# Patient Record
Sex: Female | Born: 1940 | Race: White | Hispanic: No | Smoking: Never smoker
Health system: Southern US, Community
[De-identification: ages and names within clinical notes are randomized; demographics above are authoritative.]

## PROBLEM LIST (undated history)

## (undated) DIAGNOSIS — C801 Malignant (primary) neoplasm, unspecified: Secondary | ICD-10-CM

## (undated) DIAGNOSIS — K5790 Diverticulosis of intestine, part unspecified, without perforation or abscess without bleeding: Secondary | ICD-10-CM

## (undated) DIAGNOSIS — K219 Gastro-esophageal reflux disease without esophagitis: Secondary | ICD-10-CM

## (undated) DIAGNOSIS — K8020 Calculus of gallbladder without cholecystitis without obstruction: Secondary | ICD-10-CM

## (undated) DIAGNOSIS — H269 Unspecified cataract: Secondary | ICD-10-CM

## (undated) DIAGNOSIS — T7840XA Allergy, unspecified, initial encounter: Secondary | ICD-10-CM

## (undated) DIAGNOSIS — F32A Depression, unspecified: Secondary | ICD-10-CM

## (undated) DIAGNOSIS — Z8719 Personal history of other diseases of the digestive system: Secondary | ICD-10-CM

## (undated) DIAGNOSIS — I10 Essential (primary) hypertension: Secondary | ICD-10-CM

## (undated) DIAGNOSIS — E785 Hyperlipidemia, unspecified: Secondary | ICD-10-CM

## (undated) DIAGNOSIS — M1990 Unspecified osteoarthritis, unspecified site: Secondary | ICD-10-CM

## (undated) DIAGNOSIS — F329 Major depressive disorder, single episode, unspecified: Secondary | ICD-10-CM

## (undated) DIAGNOSIS — C4491 Basal cell carcinoma of skin, unspecified: Secondary | ICD-10-CM

## (undated) DIAGNOSIS — F419 Anxiety disorder, unspecified: Secondary | ICD-10-CM

## (undated) DIAGNOSIS — G5601 Carpal tunnel syndrome, right upper limb: Secondary | ICD-10-CM

## (undated) HISTORY — DX: Allergy, unspecified, initial encounter: T78.40XA

## (undated) HISTORY — PX: CHOLECYSTECTOMY: SHX55

## (undated) HISTORY — PX: ABDOMINAL HYSTERECTOMY: SHX81

## (undated) HISTORY — DX: Unspecified cataract: H26.9

## (undated) HISTORY — DX: Calculus of gallbladder without cholecystitis without obstruction: K80.20

## (undated) HISTORY — DX: Diverticulosis of intestine, part unspecified, without perforation or abscess without bleeding: K57.90

## (undated) HISTORY — PX: ACHILLES TENDON SURGERY: SHX542

## (undated) HISTORY — DX: Personal history of other diseases of the digestive system: Z87.19

## (undated) HISTORY — PX: APPENDECTOMY: SHX54

## (undated) HISTORY — PX: TUBAL LIGATION: SHX77

## (undated) HISTORY — PX: COLONOSCOPY WITH ESOPHAGOGASTRODUODENOSCOPY (EGD): SHX5779

---

## 1998-07-02 ENCOUNTER — Encounter: Payer: Self-pay | Admitting: Surgery

## 1998-07-02 ENCOUNTER — Ambulatory Visit (HOSPITAL_COMMUNITY): Admission: RE | Admit: 1998-07-02 | Discharge: 1998-07-03 | Payer: Self-pay | Admitting: Surgery

## 1999-04-23 ENCOUNTER — Encounter: Admission: RE | Admit: 1999-04-23 | Discharge: 1999-04-23 | Payer: Self-pay | Admitting: *Deleted

## 1999-04-23 ENCOUNTER — Encounter: Payer: Self-pay | Admitting: *Deleted

## 1999-05-04 ENCOUNTER — Encounter: Admission: RE | Admit: 1999-05-04 | Discharge: 1999-05-04 | Payer: Self-pay | Admitting: Internal Medicine

## 1999-05-04 ENCOUNTER — Encounter: Payer: Self-pay | Admitting: Internal Medicine

## 1999-05-13 ENCOUNTER — Other Ambulatory Visit: Admission: RE | Admit: 1999-05-13 | Discharge: 1999-05-13 | Payer: Self-pay | Admitting: *Deleted

## 2000-04-11 ENCOUNTER — Other Ambulatory Visit: Admission: RE | Admit: 2000-04-11 | Discharge: 2000-04-11 | Payer: Self-pay | Admitting: *Deleted

## 2000-05-04 ENCOUNTER — Encounter: Admission: RE | Admit: 2000-05-04 | Discharge: 2000-05-04 | Payer: Self-pay | Admitting: *Deleted

## 2000-05-04 ENCOUNTER — Encounter: Payer: Self-pay | Admitting: *Deleted

## 2000-06-21 ENCOUNTER — Encounter: Payer: Self-pay | Admitting: Internal Medicine

## 2000-06-21 ENCOUNTER — Encounter: Admission: RE | Admit: 2000-06-21 | Discharge: 2000-06-21 | Payer: Self-pay | Admitting: Internal Medicine

## 2001-04-17 ENCOUNTER — Other Ambulatory Visit: Admission: RE | Admit: 2001-04-17 | Discharge: 2001-04-17 | Payer: Self-pay | Admitting: Obstetrics and Gynecology

## 2002-03-28 ENCOUNTER — Encounter: Payer: Self-pay | Admitting: Neurology

## 2002-03-28 ENCOUNTER — Encounter: Admission: RE | Admit: 2002-03-28 | Discharge: 2002-03-28 | Payer: Self-pay | Admitting: Neurology

## 2002-04-16 ENCOUNTER — Encounter: Admission: RE | Admit: 2002-04-16 | Discharge: 2002-06-07 | Payer: Self-pay | Admitting: Neurology

## 2003-09-18 ENCOUNTER — Emergency Department (HOSPITAL_COMMUNITY): Admission: EM | Admit: 2003-09-18 | Discharge: 2003-09-18 | Payer: Self-pay | Admitting: Emergency Medicine

## 2003-10-10 ENCOUNTER — Ambulatory Visit (HOSPITAL_COMMUNITY): Admission: RE | Admit: 2003-10-10 | Discharge: 2003-10-10 | Payer: Self-pay | Admitting: Internal Medicine

## 2003-11-29 ENCOUNTER — Encounter (INDEPENDENT_AMBULATORY_CARE_PROVIDER_SITE_OTHER): Payer: Self-pay | Admitting: Specialist

## 2003-11-29 ENCOUNTER — Ambulatory Visit (HOSPITAL_BASED_OUTPATIENT_CLINIC_OR_DEPARTMENT_OTHER): Admission: RE | Admit: 2003-11-29 | Discharge: 2003-11-29 | Payer: Self-pay | Admitting: Plastic Surgery

## 2003-11-29 ENCOUNTER — Ambulatory Visit (HOSPITAL_COMMUNITY): Admission: RE | Admit: 2003-11-29 | Discharge: 2003-11-29 | Payer: Self-pay | Admitting: Plastic Surgery

## 2004-04-08 ENCOUNTER — Encounter: Admission: RE | Admit: 2004-04-08 | Discharge: 2004-04-08 | Payer: Self-pay | Admitting: Internal Medicine

## 2004-06-04 ENCOUNTER — Other Ambulatory Visit: Admission: RE | Admit: 2004-06-04 | Discharge: 2004-06-04 | Payer: Self-pay | Admitting: Obstetrics and Gynecology

## 2005-02-11 ENCOUNTER — Ambulatory Visit: Payer: Self-pay | Admitting: Internal Medicine

## 2005-04-13 ENCOUNTER — Ambulatory Visit: Payer: Self-pay | Admitting: Internal Medicine

## 2005-04-29 ENCOUNTER — Ambulatory Visit: Payer: Self-pay | Admitting: Internal Medicine

## 2005-04-29 ENCOUNTER — Encounter (INDEPENDENT_AMBULATORY_CARE_PROVIDER_SITE_OTHER): Payer: Self-pay | Admitting: *Deleted

## 2008-04-24 ENCOUNTER — Telehealth (INDEPENDENT_AMBULATORY_CARE_PROVIDER_SITE_OTHER): Payer: Self-pay | Admitting: *Deleted

## 2008-04-24 ENCOUNTER — Encounter: Payer: Self-pay | Admitting: Internal Medicine

## 2009-12-12 ENCOUNTER — Encounter: Admission: RE | Admit: 2009-12-12 | Discharge: 2009-12-12 | Payer: Self-pay | Admitting: Internal Medicine

## 2009-12-24 ENCOUNTER — Encounter: Admission: RE | Admit: 2009-12-24 | Discharge: 2009-12-24 | Payer: Self-pay | Admitting: Internal Medicine

## 2010-07-03 NOTE — Op Note (Signed)
NAMEDAVA, RENSCH NO.:  [id number]   MEDICAL RECORD NO.:  [id number]          PATIENT TYPE:  AMB   LOCATION:  DSC                          FACILITY:  [facility]   PHYSICIAN:  Etter Sjogren, M.D.     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   POSTOPERATIVE DIAGNOSIS:  1.  Basal cell carcinoma of the nose greater than 1.0 cm.  2.  Complicated open wound of the nose, 2.5 cm.   PROCEDURE PERFORMED:  1.  Excision basal cell carcinoma of the nose greater than 1.0 cm.  2.  Complex wound closure of the nose, 2.5 cm, secondary to defect from      basal cell carcinoma.   SURGEON:  Etter Sjogren, M.D.   ANESTHESIA:  Xylocaine 1% with epinephrine plus bicarb.   CLINICAL NOTE:  A 70 year old woman with basal cell carcinoma of the dorsum  of her nose.  This is biopsy proven.  The wider excision of this has been  discussed with her and she understood the __________ and the risks including  the possibility of further surgery depending on the final pathology report.  She also understands that there will be scarring and the potential for wound  healing problems or infection.  She wishes to proceed.   DESCRIPTION OF PROCEDURE:  The patient was marked for the elliptical  excision, oriented in transverse direction.  She was then prepped with  Betadine and draped with sterile drapes.  Satisfactory local anesthesia was  achieved.  The excision was performed and the specimen tagged on the  superior border with a stitch.  The wound was irrigated thoroughly and  excellent hemostasis was noted.  A layered closure with 4-0 Monocryl  interrupted inverted deep sutures, 4-0 Monocryl interrupted inverted deep  dermal sutures and a 6-0 Prolene simple running suture.  Antibiotic ointment  applied.  Tolerated well.   DISPOSITION:  Recheck in the office next week.       DB/MEDQ  D:  11/29/2003  T:  11/29/2003  Job:  [id number]

## 2011-01-04 ENCOUNTER — Other Ambulatory Visit: Payer: Self-pay | Admitting: Internal Medicine

## 2011-01-04 DIAGNOSIS — Z1231 Encounter for screening mammogram for malignant neoplasm of breast: Secondary | ICD-10-CM

## 2011-02-12 ENCOUNTER — Ambulatory Visit
Admission: RE | Admit: 2011-02-12 | Discharge: 2011-02-12 | Disposition: A | Discharge status: Home / Self Care | Payer: Medicare Other | Source: Ambulatory Visit | Attending: Internal Medicine | Admitting: Internal Medicine

## 2011-02-12 DIAGNOSIS — Z1231 Encounter for screening mammogram for malignant neoplasm of breast: Secondary | ICD-10-CM

## 2011-07-15 ENCOUNTER — Other Ambulatory Visit: Payer: Self-pay | Admitting: Orthopedic Surgery

## 2011-07-20 ENCOUNTER — Encounter (HOSPITAL_BASED_OUTPATIENT_CLINIC_OR_DEPARTMENT_OTHER): Payer: Self-pay | Admitting: *Deleted

## 2011-07-21 ENCOUNTER — Encounter (HOSPITAL_BASED_OUTPATIENT_CLINIC_OR_DEPARTMENT_OTHER)
Admission: RE | Admit: 2011-07-21 | Discharge: 2011-07-21 | Disposition: A | Discharge status: Home / Self Care | Payer: Medicare Other | Source: Ambulatory Visit | Attending: Orthopedic Surgery | Admitting: Orthopedic Surgery

## 2011-07-21 LAB — BASIC METABOLIC PANEL
BUN: 10 mg/dL (ref 6–23)
CO2: 31 mEq/L (ref 19–32)
Chloride: 100 mEq/L (ref 96–112)
Creatinine, Ser: 0.94 mg/dL (ref 0.50–1.10)
GFR calc Af Amer: 70 mL/min — ABNORMAL LOW (ref >90)
Glucose, Bld: 88 mg/dL (ref 70–99)
Potassium: 4.3 mEq/L (ref 3.5–5.1)

## 2011-07-21 NOTE — Progress Notes (Signed)
Dr.Federick reviewed EKG -- OK for surgery.

## 2011-07-22 ENCOUNTER — Other Ambulatory Visit: Payer: Self-pay | Admitting: Dermatology

## 2011-07-22 NOTE — H&P (Signed)
Mackenzie White is an 71 y.o. female.   Chief Complaint: c/o chronic and progressive numbness and tinging right hand HPI: Mackenzie White is a 71 year old [address] [employer]. She has had a 3 month history of bilateral hand numbness consistent with carpal tunnel syndrome. She has no antecedent history of injury. She has no history of neck pain. She has no history of diabetes, thyroid disease, gout or rheumatoid arthritis. She has undergone several injections into the right wrist with only short term relief. The patient wishes to proceed with right  CTR.    Past Medical History  Diagnosis Date  . Hypertension   . Hyperlipemia   . Depression   . GERD (gastroesophageal reflux disease)   . Carpal tunnel syndrome of right wrist   . Arthritis     Past Surgical History  Procedure Date  . Tubal ligation   . Cholecystectomy   . Abdominal hysterectomy   . Appendectomy   . Achilles tendon surgery     left     No family history on file. Social History:  reports that she has never smoked. She does not have any smokeless tobacco history on file. She reports that she does not drink alcohol or use illicit drugs.  Allergies:  Allergies  Allergen Reactions  . Asa (Aspirin) Hives  . Penicillins Swelling  . Sulfa Antibiotics Hives  . Tetracyclines & Related Hives    No prescriptions prior to admission    Results for orders placed during the hospital encounter of 07/23/11 (from the past 48 hour(s))  BASIC METABOLIC PANEL     Status: Abnormal   Collection Time   07/21/11 11:57 AM      Component Value Range Comment   Sodium 139  135 - 145 (mEq/L)    Potassium 4.3  3.5 - 5.1 (mEq/L)    Chloride 100  96 - 112 (mEq/L)    CO2 31  19 - 32 (mEq/L)    Glucose, Bld 88  70 - 99 (mg/dL)    BUN 10  6 - 23 (mg/dL)    Creatinine, Ser 3.41  0.50 - 1.10 (mg/dL)    Calcium 9.7  8.4 - 10.5 (mg/dL)    GFR calc non Af Amer 60 (*) >90 (mL/min)    GFR calc Af Amer 70 (*) >90 (mL/min)      No results found.   Pertinent items are noted in HPI.  Height 5' 6.5" (1.689 m), weight 82.555 kg (182 lb).  General appearance: alert Head: Normocephalic, without obvious abnormality Neck: supple, symmetrical, trachea midline Resp: clear to auscultation bilaterally Cardio: regular rate and rhythm GI: normal findings: bowel sounds normal Extremities:Inspection of her hands reveals no muscle atrophy or deformity. She does have a positive Tinel's sign on the right. She has a positive elbow hyperflexion test reproducing small finger numbness on the right at 1 minute. She has positive wrist flexion right.  Her cervical ROM reveals moderate stiffness with forward flexion 30 degrees, extension 10 degrees, rotation right 45 and rotation left 35. She has no radicular signs or symptoms.   Pulses: 2+ and symmetric Skin: normal Neurologic: Grossly normal    Assessment/Plan Impression: Right CTS  Plan: To the OR for right CTR. The procedure, risks and post-op course were discussed with the patient at length and she was in agreement with the plan.  DASNOIT,Antwione Picotte J 07/22/2011, 3:56 PM    H&P documentation: 07/23/2011  -History and Physical Reviewed  -Patient has been re-examined  -No  change in the plan of care  Wyn Forsterobert V Cleona Doubleday Jr, MD

## 2011-07-23 ENCOUNTER — Encounter (HOSPITAL_BASED_OUTPATIENT_CLINIC_OR_DEPARTMENT_OTHER): Payer: Self-pay | Admitting: Anesthesiology

## 2011-07-23 ENCOUNTER — Ambulatory Visit (HOSPITAL_BASED_OUTPATIENT_CLINIC_OR_DEPARTMENT_OTHER)
Admission: RE | Admit: 2011-07-23 | Discharge: 2011-07-23 | Disposition: A | Discharge status: Home / Self Care | Payer: Medicare Other | Source: Ambulatory Visit | Attending: Orthopedic Surgery | Admitting: Orthopedic Surgery

## 2011-07-23 ENCOUNTER — Ambulatory Visit (HOSPITAL_BASED_OUTPATIENT_CLINIC_OR_DEPARTMENT_OTHER): Payer: Medicare Other | Admitting: Anesthesiology

## 2011-07-23 ENCOUNTER — Encounter (HOSPITAL_BASED_OUTPATIENT_CLINIC_OR_DEPARTMENT_OTHER)
Admission: RE | Disposition: A | Discharge status: Home / Self Care | Payer: Self-pay | Source: Ambulatory Visit | Attending: Orthopedic Surgery

## 2011-07-23 ENCOUNTER — Encounter (HOSPITAL_BASED_OUTPATIENT_CLINIC_OR_DEPARTMENT_OTHER): Payer: Self-pay | Admitting: *Deleted

## 2011-07-23 DIAGNOSIS — I10 Essential (primary) hypertension: Secondary | ICD-10-CM | POA: Insufficient documentation

## 2011-07-23 DIAGNOSIS — K219 Gastro-esophageal reflux disease without esophagitis: Secondary | ICD-10-CM | POA: Insufficient documentation

## 2011-07-23 DIAGNOSIS — G5600 Carpal tunnel syndrome, unspecified upper limb: Secondary | ICD-10-CM | POA: Insufficient documentation

## 2011-07-23 DIAGNOSIS — Z01812 Encounter for preprocedural laboratory examination: Secondary | ICD-10-CM | POA: Insufficient documentation

## 2011-07-23 DIAGNOSIS — Z01810 Encounter for preprocedural cardiovascular examination: Secondary | ICD-10-CM | POA: Insufficient documentation

## 2011-07-23 HISTORY — DX: Hyperlipidemia, unspecified: E78.5

## 2011-07-23 HISTORY — PX: CARPAL TUNNEL RELEASE: SHX101

## 2011-07-23 HISTORY — DX: Unspecified osteoarthritis, unspecified site: M19.90

## 2011-07-23 HISTORY — DX: Essential (primary) hypertension: I10

## 2011-07-23 HISTORY — DX: Gastro-esophageal reflux disease without esophagitis: K21.9

## 2011-07-23 HISTORY — DX: Depression, unspecified: F32.A

## 2011-07-23 HISTORY — DX: Major depressive disorder, single episode, unspecified: F32.9

## 2011-07-23 HISTORY — DX: Carpal tunnel syndrome, right upper limb: G56.01

## 2011-07-23 SURGERY — CARPAL TUNNEL RELEASE
Anesthesia: General | Site: Hand | Laterality: Right | Wound class: Clean

## 2011-07-23 MED ORDER — HYDROCODONE-ACETAMINOPHEN 5-325 MG PO TABS
1.0000 | ORAL_TABLET | Freq: Once | ORAL | Status: AC
  Administered 2011-07-23: 1 via ORAL

## 2011-07-23 MED ORDER — METOCLOPRAMIDE HCL 5 MG/ML IJ SOLN: 10.0000 mg | Freq: Once | INTRAMUSCULAR | Status: DC | PRN

## 2011-07-23 MED ORDER — HYDROCODONE-ACETAMINOPHEN 5-325 MG PO TABS: ORAL_TABLET | ORAL | Status: AC

## 2011-07-23 MED ORDER — ONDANSETRON HCL 4 MG/2ML IJ SOLN
INTRAMUSCULAR | Status: DC | PRN
  Administered 2011-07-23: 4 mg via INTRAVENOUS

## 2011-07-23 MED ORDER — CHLORHEXIDINE GLUCONATE 4 % EX LIQD: 60.0000 mL | Freq: Once | CUTANEOUS | Status: DC

## 2011-07-23 MED ORDER — FENTANYL CITRATE 0.05 MG/ML IJ SOLN: 25.0000 ug | INTRAMUSCULAR | Status: DC | PRN

## 2011-07-23 MED ORDER — LIDOCAINE HCL (CARDIAC) 20 MG/ML IV SOLN
INTRAVENOUS | Status: DC | PRN
  Administered 2011-07-23: 200 mg via INTRAVENOUS
  Administered 2011-07-23: 50 mg via INTRAVENOUS
  Administered 2011-07-23: 100 mg via INTRAVENOUS

## 2011-07-23 MED ORDER — OXYCODONE HCL 5 MG PO TABS: 5.0000 mg | ORAL_TABLET | Freq: Once | ORAL | Status: DC | PRN

## 2011-07-23 MED ORDER — LIDOCAINE HCL 2 % IJ SOLN
INTRAMUSCULAR | Status: DC | PRN
  Administered 2011-07-23: 4 mL

## 2011-07-23 MED ORDER — GLYCOPYRROLATE 0.2 MG/ML IJ SOLN
INTRAMUSCULAR | Status: DC | PRN
  Administered 2011-07-23: 0.2 mg via INTRAVENOUS

## 2011-07-23 MED ORDER — DEXAMETHASONE SODIUM PHOSPHATE 4 MG/ML IJ SOLN
INTRAMUSCULAR | Status: DC | PRN
  Administered 2011-07-23: 10 mg via INTRAVENOUS

## 2011-07-23 MED ORDER — FENTANYL CITRATE 0.05 MG/ML IJ SOLN
INTRAMUSCULAR | Status: DC | PRN
  Administered 2011-07-23: 50 ug via INTRAVENOUS
  Administered 2011-07-23: 25 ug via INTRAVENOUS

## 2011-07-23 MED ORDER — METOCLOPRAMIDE HCL 5 MG/ML IJ SOLN
INTRAMUSCULAR | Status: DC | PRN
  Administered 2011-07-23: 10 mg via INTRAVENOUS

## 2011-07-23 MED ORDER — LACTATED RINGERS IV SOLN
INTRAVENOUS | Status: DC
  Administered 2011-07-23: 07:00:00 via INTRAVENOUS

## 2011-07-23 SURGICAL SUPPLY — 40 items
BANDAGE ADHESIVE 1X3 (GAUZE/BANDAGES/DRESSINGS) IMPLANT
BANDAGE ELASTIC 3 VELCRO ST LF (GAUZE/BANDAGES/DRESSINGS) ×2 IMPLANT
BLADE SURG 15 STRL LF DISP TIS (BLADE) ×1 IMPLANT
BLADE SURG 15 STRL SS (BLADE) ×2
BNDG CMPR 9X4 STRL LF SNTH (GAUZE/BANDAGES/DRESSINGS) ×1
BNDG ESMARK 4X9 LF (GAUZE/BANDAGES/DRESSINGS) ×1 IMPLANT
BRUSH SCRUB EZ PLAIN DRY (MISCELLANEOUS) ×2 IMPLANT
CLOTH BEACON ORANGE TIMEOUT ST (SAFETY) ×2 IMPLANT
CORDS BIPOLAR (ELECTRODE) IMPLANT
COVER MAYO STAND STRL (DRAPES) ×2 IMPLANT
COVER TABLE BACK 60X90 (DRAPES) ×2 IMPLANT
CUFF TOURNIQUET SINGLE 18IN (TOURNIQUET CUFF) ×1 IMPLANT
DECANTER SPIKE VIAL GLASS SM (MISCELLANEOUS) ×1 IMPLANT
DRAPE EXTREMITY T 121X128X90 (DRAPE) ×2 IMPLANT
DRAPE SURG 17X23 STRL (DRAPES) ×2 IMPLANT
GLOVE BIO SURGEON STRL SZ 6.5 (GLOVE) ×1 IMPLANT
GLOVE BIOGEL M STRL SZ7.5 (GLOVE) ×2 IMPLANT
GLOVE BIOGEL PI IND STRL 7.0 (GLOVE) IMPLANT
GLOVE BIOGEL PI INDICATOR 7.0 (GLOVE) ×1
GLOVE EXAM NITRILE EXT CUFF MD (GLOVE) ×1 IMPLANT
GLOVE ORTHO TXT STRL SZ7.5 (GLOVE) ×2 IMPLANT
GOWN PREVENTION PLUS XLARGE (GOWN DISPOSABLE) ×2 IMPLANT
GOWN PREVENTION PLUS XXLARGE (GOWN DISPOSABLE) ×4 IMPLANT
NEEDLE 27GAX1X1/2 (NEEDLE) ×1 IMPLANT
PACK BASIN DAY SURGERY FS (CUSTOM PROCEDURE TRAY) ×2 IMPLANT
PAD CAST 3X4 CTTN HI CHSV (CAST SUPPLIES) ×1 IMPLANT
PADDING CAST ABS 4INX4YD NS (CAST SUPPLIES) ×1
PADDING CAST ABS COTTON 4X4 ST (CAST SUPPLIES) ×1 IMPLANT
PADDING CAST COTTON 3X4 STRL (CAST SUPPLIES) ×2
SPLINT PLASTER CAST XFAST 3X15 (CAST SUPPLIES) ×5 IMPLANT
SPLINT PLASTER XTRA FASTSET 3X (CAST SUPPLIES) ×5
SPONGE GAUZE 4X4 12PLY (GAUZE/BANDAGES/DRESSINGS) ×2 IMPLANT
STOCKINETTE 4X48 STRL (DRAPES) ×2 IMPLANT
STRIP CLOSURE SKIN 1/2X4 (GAUZE/BANDAGES/DRESSINGS) ×2 IMPLANT
SUT PROLENE 3 0 PS 2 (SUTURE) ×2 IMPLANT
SYR 3ML 23GX1 SAFETY (SYRINGE) IMPLANT
SYR CONTROL 10ML LL (SYRINGE) ×1 IMPLANT
TRAY DSU PREP LF (CUSTOM PROCEDURE TRAY) ×2 IMPLANT
UNDERPAD 30X30 INCONTINENT (UNDERPADS AND DIAPERS) ×2 IMPLANT
WATER STERILE IRR 1000ML POUR (IV SOLUTION) ×1 IMPLANT

## 2011-07-23 NOTE — Discharge Instructions (Signed)

## 2011-07-23 NOTE — Brief Op Note (Signed)
07/23/2011  8:08 AM  PATIENT:  Mackenzie White  71 y.o. female  PRE-OPERATIVE DIAGNOSIS:  Right Carpal Tunnel Syndrome  POST-OPERATIVE DIAGNOSIS:  Right Carpal Tunnel Syndrome  PROCEDURE:  Procedure(s) (LRB): CARPAL TUNNEL RELEASE (Right)  SURGEON:  Surgeon(s) and Role:    * Wyn Forster., MD - Primary  PHYSICIAN ASSISTANT:   ASSISTANTS: Mallory Shirk.A-C    ANESTHESIA:   general  EBL:  Total I/O In: 800 [I.V.:800] Out: -   BLOOD ADMINISTERED:none  DRAINS: none   LOCAL MEDICATIONS USED:  XYLOCAINE   SPECIMEN:  No Specimen  DISPOSITION OF SPECIMEN:  N/A  COUNTS:  YES  TOURNIQUET:   Total Tourniquet Time Documented: Upper Arm (Right) - 8 minutes  DICTATION: .Other Dictation: Dictation Number [id number]  PLAN OF CARE: Discharge to home after PACU  PATIENT DISPOSITION:  PACU - hemodynamically stable.

## 2011-07-23 NOTE — Anesthesia Procedure Notes (Signed)
Procedure Name: LMA Insertion Performed by: York Grice Pre-anesthesia Checklist: Patient identified, Timeout performed, Emergency Drugs available, Suction available and Patient being monitored Patient Re-evaluated:Patient Re-evaluated prior to inductionOxygen Delivery Method: Circle system utilized Preoxygenation: Pre-oxygenation with 100% oxygen Intubation Type: IV induction Ventilation: Mask ventilation without difficulty LMA: LMA inserted LMA Size: 5.0 Number of attempts: 2 Placement Confirmation: breath sounds checked- equal and bilateral and positive ETCO2 Tube secured with: Tape Dental Injury: Teeth and Oropharynx as per pre-operative assessment

## 2011-07-23 NOTE — Op Note (Signed)
[name], [name] NO.:  [id number]  MEDICAL RECORD NO.:  [id number]  LOCATION:                                 FACILITY:  PHYSICIAN:  Katy Fitch. Klark Vanderhoef, M.D.      DATE OF BIRTH:  DATE OF PROCEDURE:  07/23/2011 DATE OF DISCHARGE:                              OPERATIVE REPORT   PREOPERATIVE DIAGNOSIS:  Entrapment neuropathy, median nerve, right carpal tunnel.  POSTOPERATIVE DIAGNOSIS: Entrapment neuropathy, median nerve, right carpal tunnel.  OPERATION:  Release of right transverse carpal ligament.  OPERATING SURGEON:  Katy Fitch. Wayne Wicklund, M.D.  ASSISTANT:  Marveen Reeks. Dasnoit, PA-C.  ANESTHESIA:  General by LMA.  SUPERVISING ANESTHESIOLOGIST:  Janetta Hora. Gelene Mink, M.D.  INDICATIONS:  Mackenzie White is a 71 year old woman who referred through the courtesy of Dr. Wylene Simmer.  Clinical examination revealed signs of bilateral carpal tunnel syndrome, worse on the right. Electrodiagnostic studies confirmed right carpal tunnel syndrome.  After informed consent, she was brought to the operating at this time for release of her right transcarpal ligament.  PROCEDURE:  Pacey Willadsen was brought to room 1 of the [facility] and placed in supine position on the operating table.  Following a detailed anesthesia informed consent by Dr. Gelene Mink, general anesthesia by LMA technique was recommended and accepted.  In room 1, under Dr. Thornton Dales direct supervision, general anesthesia by LMA technique was induced followed by routine Betadine scrub and paint of the right upper extremity.  Sterile drapes were applied followed by a pneumatic tourniquet to the proximal right brachium.  Following a routine surgical time-out, the right arm was exsanguinated with Esmarch bandage and the arterial tourniquet was inflated to 220 mmHg.  The procedure commenced with a short incision in the line of the ring finger in the palm.  Subcutaneous tissues were carefully  divided to reveal the palmar fascia.  This split longitudinally to reveal the common sensory branch of the median nerve.  These were followed back to the median nerve proper which was gently isolated from the transcarpal ligament with a Insurance risk surveyor.  The transverse carpal ligament was then released along its ulnar border with scissors extending into the distal forearm.  This widely opened carpal canal.  No masses or other predicaments were noted.  Bleeding points along the margin of the released ligament were electrocauterized with bipolar current followed by repair of the skin with intradermal 3-0 Prolene suture.  A compressive dressing was applied with a volar plaster splint maintaining the wrist in 10 degrees of dorsiflexion.  For aftercare, Earlean Polka is provided a prescription for hydrocodone 5 mg 1 p.o. q.4 hours p.r.n. pain 20 tablets without refill.     Katy Fitch Yamil Oelke, M.D.     RVS/MEDQ  D:  07/23/2011  T:  07/23/2011  Job:  [id number]  cc:   Gaspar Garbe, M.D.

## 2011-07-23 NOTE — OR Nursing (Signed)
0757--Time for time out documented at 0747 incorrectly--was performed at 0757.

## 2011-07-23 NOTE — Anesthesia Preprocedure Evaluation (Signed)
Anesthesia Evaluation  Patient identified by MRN, date of birth, ID band Patient awake    Reviewed: Allergy & Precautions, H&P , NPO status , Patient's Chart, lab work & pertinent test results, reviewed documented beta blocker date and time   Airway Mallampati: II TM Distance: >3 FB Neck ROM: full    Dental   Pulmonary neg pulmonary ROS,          Cardiovascular hypertension, On Medications     Neuro/Psych  Neuromuscular disease negative psych ROS   GI/Hepatic negative GI ROS, Neg liver ROS,   Endo/Other  negative endocrine ROS  Renal/GU negative Renal ROS  negative genitourinary   Musculoskeletal   Abdominal   Peds  Hematology negative hematology ROS (+)   Anesthesia Other Findings See surgeon's H&P   Reproductive/Obstetrics negative OB ROS                           Anesthesia Physical Anesthesia Plan  ASA: II  Anesthesia Plan: General   Post-op Pain Management:    Induction: Intravenous  Airway Management Planned: LMA  Additional Equipment:   Intra-op Plan:   Post-operative Plan: Extubation in OR  Informed Consent: I have reviewed the patients History and Physical, chart, labs and discussed the procedure including the risks, benefits and alternatives for the proposed anesthesia with the patient or authorized representative who has indicated his/her understanding and acceptance.   Dental Advisory Given  Plan Discussed with: CRNA and Surgeon  Anesthesia Plan Comments:         Anesthesia Quick Evaluation

## 2011-07-23 NOTE — Anesthesia Postprocedure Evaluation (Signed)
Anesthesia Post Note  Patient: Mackenzie White  Procedure(s) Performed: Procedure(s) (LRB): CARPAL TUNNEL RELEASE (Right)  Anesthesia type: General  Patient location: PACU  Post pain: Pain level controlled  Post assessment: Patient's Cardiovascular Status Stable  Last Vitals:  Filed Vitals:   07/23/11 0845  BP: 122/68  Pulse: 102  Temp:   Resp: 16    Post vital signs: Reviewed and stable  Level of consciousness: alert  Complications: No apparent anesthesia complications

## 2011-07-23 NOTE — Transfer of Care (Signed)
Immediate Anesthesia Transfer of Care Note  Patient: Mackenzie White  Procedure(s) Performed: Procedure(s) (LRB): CARPAL TUNNEL RELEASE (Right)  Patient Location: PACU  Anesthesia Type: General  Level of Consciousness: awake and alert   Airway & Oxygen Therapy: Patient Spontanous Breathing and Patient connected to face mask oxygen  Post-op Assessment: Report given to PACU RN and Post -op Vital signs reviewed and stable  Post vital signs: Reviewed and stable  Complications: No apparent anesthesia complications

## 2011-07-26 ENCOUNTER — Encounter (HOSPITAL_BASED_OUTPATIENT_CLINIC_OR_DEPARTMENT_OTHER): Payer: Self-pay | Admitting: Orthopedic Surgery

## 2011-11-11 ENCOUNTER — Encounter: Payer: Self-pay | Admitting: Internal Medicine

## 2011-11-17 ENCOUNTER — Other Ambulatory Visit: Payer: Self-pay | Admitting: Dermatology

## 2012-12-19 ENCOUNTER — Other Ambulatory Visit: Payer: Self-pay | Admitting: Dermatology

## 2013-02-15 DIAGNOSIS — H269 Unspecified cataract: Secondary | ICD-10-CM

## 2013-02-15 HISTORY — DX: Unspecified cataract: H26.9

## 2014-07-10 ENCOUNTER — Other Ambulatory Visit: Payer: Self-pay

## 2014-07-10 DIAGNOSIS — Z1231 Encounter for screening mammogram for malignant neoplasm of breast: Secondary | ICD-10-CM

## 2014-08-07 ENCOUNTER — Ambulatory Visit
Admission: RE | Admit: 2014-08-07 | Discharge: 2014-08-07 | Disposition: A | Discharge status: Home / Self Care | Payer: Medicare Other | Source: Ambulatory Visit

## 2014-08-07 DIAGNOSIS — Z1231 Encounter for screening mammogram for malignant neoplasm of breast: Secondary | ICD-10-CM

## 2014-08-09 ENCOUNTER — Ambulatory Visit
Admission: RE | Admit: 2014-08-09 | Discharge: 2014-08-09 | Disposition: A | Discharge status: Home / Self Care | Payer: Medicare Other | Source: Ambulatory Visit | Attending: Internal Medicine | Admitting: Internal Medicine

## 2014-08-09 ENCOUNTER — Other Ambulatory Visit: Payer: Self-pay | Admitting: Internal Medicine

## 2014-08-09 DIAGNOSIS — R922 Inconclusive mammogram: Secondary | ICD-10-CM

## 2014-08-09 DIAGNOSIS — N6320 Unspecified lump in the left breast, unspecified quadrant: Secondary | ICD-10-CM

## 2014-08-12 ENCOUNTER — Other Ambulatory Visit: Payer: Self-pay | Admitting: Internal Medicine

## 2014-08-12 DIAGNOSIS — N6320 Unspecified lump in the left breast, unspecified quadrant: Secondary | ICD-10-CM

## 2014-08-20 ENCOUNTER — Other Ambulatory Visit: Payer: Self-pay | Admitting: Internal Medicine

## 2014-08-20 ENCOUNTER — Ambulatory Visit
Admission: RE | Admit: 2014-08-20 | Discharge: 2014-08-20 | Disposition: A | Discharge status: Home / Self Care | Payer: Medicare Other | Source: Ambulatory Visit | Attending: Internal Medicine | Admitting: Internal Medicine

## 2014-08-20 DIAGNOSIS — N6320 Unspecified lump in the left breast, unspecified quadrant: Secondary | ICD-10-CM

## 2016-02-14 ENCOUNTER — Emergency Department
Admission: EM | Admit: 2016-02-14 | Discharge: 2016-02-14 | Disposition: A | Discharge status: Home / Self Care | Payer: Medicare Other | Attending: Emergency Medicine | Admitting: Emergency Medicine

## 2016-02-14 DIAGNOSIS — I10 Essential (primary) hypertension: Secondary | ICD-10-CM | POA: Insufficient documentation

## 2016-02-14 DIAGNOSIS — R21 Rash and other nonspecific skin eruption: Secondary | ICD-10-CM | POA: Diagnosis present

## 2016-02-14 DIAGNOSIS — Z79899 Other long term (current) drug therapy: Secondary | ICD-10-CM | POA: Diagnosis not present

## 2016-02-14 DIAGNOSIS — T7840XA Allergy, unspecified, initial encounter: Secondary | ICD-10-CM | POA: Insufficient documentation

## 2016-02-14 MED ORDER — PREDNISONE 10 MG PO TABS: 40.0000 mg | ORAL_TABLET | Freq: Every day | ORAL | 0 refills | Status: AC

## 2016-02-14 MED ORDER — RANITIDINE HCL 150 MG PO TABS: 150.0000 mg | ORAL_TABLET | Freq: Two times a day (BID) | ORAL | 0 refills | Status: DC

## 2016-02-14 MED ORDER — LORATADINE 10 MG PO CAPS: 10.0000 mg | ORAL_CAPSULE | Freq: Every day | ORAL | 0 refills | Status: DC

## 2016-02-14 MED ORDER — FAMOTIDINE 20 MG PO TABS
10.0000 mg | ORAL_TABLET | ORAL | Status: AC
  Administered 2016-02-14: 10 mg via ORAL
  Filled 2016-02-14: qty 1

## 2016-02-14 MED ORDER — CYPROHEPTADINE HCL 4 MG PO TABS
4.0000 mg | ORAL_TABLET | ORAL | Status: AC
  Administered 2016-02-14: 4 mg via ORAL
  Filled 2016-02-14: qty 1

## 2016-02-14 MED ORDER — METHYLPREDNISOLONE SODIUM SUCC 125 MG IJ SOLR
80.0000 mg | INTRAMUSCULAR | Status: AC
Start: 2016-02-14 — End: 2016-02-14
  Administered 2016-02-14: 80 mg via INTRAMUSCULAR
  Filled 2016-02-14: qty 2

## 2016-02-14 MED ORDER — RANITIDINE HCL 150 MG PO TABS: 150.0000 mg | ORAL_TABLET | Freq: Two times a day (BID) | ORAL | 1 refills | Status: DC

## 2016-02-14 NOTE — ED Notes (Signed)
FIRST NURSE NOTE: Noticed welts for the past 3 days. Unknown origin

## 2016-02-14 NOTE — ED Provider Notes (Signed)
[facility] Emergency Department Provider Note  ____________________________________________  Time seen: Approximately 3:47 PM  I have reviewed the triage vital signs and the nursing notes.   HISTORY  Chief Complaint Rash    HPI Mackenzie White is a 75 y.o. female that presents to the emergency department with a rash that started Thursday night. Patient states that rash itches and she has wheals on her hands, head, and buttocks. No difficulty breathing or throat tightening. Patient tried to get in to see her primary care provider on Friday but was unable to get in. Patient has been taking Benadryl, which is not helping. She denies any new medications, lotions, laundry detergents. Patient states that she ate a new type of dessert at Christmas that could be the cause.   Past Medical History:  Diagnosis Date  . Arthritis   . Carpal tunnel syndrome of right wrist   . Depression   . GERD (gastroesophageal reflux disease)   . Hyperlipemia   . Hypertension     There are no active problems to display for this patient.   Past Surgical History:  Procedure Laterality Date  . ABDOMINAL HYSTERECTOMY    . ACHILLES TENDON SURGERY     left   . APPENDECTOMY    . CARPAL TUNNEL RELEASE  07/23/2011   Procedure: CARPAL TUNNEL RELEASE;  Surgeon: Cammie Sickle., MD;  Location: [place];  Service: Orthopedics;  Laterality: Right;  . CHOLECYSTECTOMY    . TUBAL LIGATION      Prior to Admission medications   Medication Sig Start Date End Date Taking? Authorizing Provider  acetaminophen (TYLENOL) 325 MG tablet Take 650 mg by mouth every 6 (six) hours as needed.    Historical Provider, MD  Ascorbic Acid (VITAMIN C) 1000 MG tablet Take 1,000 mg by mouth daily.    Historical Provider, MD  calcium-vitamin D (OSCAL WITH D) 500-200 MG-UNIT per tablet Take 1 tablet by mouth daily.    Historical Provider, MD  diphenhydrAMINE (BENADRYL) 25 MG tablet Take 25 mg  by mouth at bedtime as needed. Takes 2    Historical Provider, MD  DULoxetine (CYMBALTA) 30 MG capsule Take 30 mg by mouth daily.    Historical Provider, MD  esomeprazole (NEXIUM) 40 MG capsule Take 40 mg by mouth daily before breakfast.    Historical Provider, MD  lisinopril-hydrochlorothiazide (PRINZIDE,ZESTORETIC) 20-12.5 MG per tablet Take 1 tablet by mouth daily.    Historical Provider, MD  Loratadine 10 MG CAPS Take 1 capsule (10 mg total) by mouth daily. 02/14/16 02/21/16  Laban Emperor, PA-C  predniSONE (DELTASONE) 10 MG tablet Take 4 tablets (40 mg total) by mouth daily. 02/14/16 02/19/16  Laban Emperor, PA-C  ranitidine (ZANTAC) 150 MG tablet Take 1 tablet (150 mg total) by mouth 2 (two) times daily. 02/14/16 02/21/16  Laban Emperor, PA-C    Allergies Asa [aspirin]; Penicillins; Sulfa antibiotics; and Tetracyclines & related  No family history on file.  Social History Social History  Substance Use Topics  . Smoking status: Never Smoker  . Smokeless tobacco: Not on file  . Alcohol use No     Review of Systems  Constitutional: No fever/chills ENT: No upper respiratory complaints. Cardiovascular: No chest pain. Respiratory: No SOB. Gastrointestinal: No abdominal pain.  No nausea, no vomiting.  Musculoskeletal: Negative for musculoskeletal pain. Skin: Negative for abrasions, lacerations, ecchymosis.   ____________________________________________   PHYSICAL EXAM:  VITAL SIGNS: ED Triage Vitals  Enc Vitals Group  BP 02/14/16 1321 (!) 162/82     Pulse Rate 02/14/16 1321 100     Resp 02/14/16 1321 18     Temp 02/14/16 1321 98.2 F (36.8 C)     Temp Source 02/14/16 1321 Oral     SpO2 02/14/16 1321 97 %     Weight 02/14/16 1321 172 lb (78 kg)     Height 02/14/16 1321 5\' 7"  (1.702 m)     Head Circumference --      Peak Flow --      Pain Score 02/14/16 1320 7     Pain Loc --      Pain Edu? --      Excl. in [place]? --      Constitutional: Alert and oriented. Well  appearing and in no acute distress. Eyes: Conjunctivae are normal. PERRL. EOMI. Head: Atraumatic. ENT:      Ears:      Nose: No congestion/rhinnorhea.      Mouth/Throat: Mucous membranes are moist.  Neck: No stridor.    Cardiovascular: Normal rate, regular rhythm. Normal S1 and S2.  Good peripheral circulation. Respiratory: Normal respiratory effort without tachypnea or retractions. Lungs CTAB. Good air entry to the bases with no decreased or absent breath sounds. Musculoskeletal: Full range of motion to all extremities. No gross deformities appreciated. Neurologic:  Normal speech and language. No gross focal neurologic deficits are appreciated.  Skin:  Skin is warm, dry and intact. Wheals over hands, scalp, buttocks. Psychiatric: Mood and affect are normal. Speech and behavior are normal. Patient exhibits appropriate insight and judgement.   ____________________________________________   LABS (all labs ordered are listed, but only abnormal results are displayed)  Labs Reviewed - No data to display ____________________________________________  EKG   ____________________________________________  RADIOLOGY   No results found.  ____________________________________________    PROCEDURES  Procedure(s) performed:    Procedures    Medications  cyproheptadine (PERIACTIN) 4 MG tablet 4 mg (4 mg Oral Given 02/14/16 1607)  famotidine (PEPCID) tablet 10 mg (10 mg Oral Given 02/14/16 1547)  methylPREDNISolone sodium succinate (SOLU-MEDROL) 125 mg/2 mL injection 80 mg (80 mg Intramuscular Given 02/14/16 1547)     ____________________________________________   INITIAL IMPRESSION / ASSESSMENT AND PLAN / ED COURSE  Pertinent labs & imaging results that were available during my care of the patient were reviewed by me and considered in my medical decision making (see chart for details).  Review of the [place] CSRS was performed in accordance of the [place] prior to dispensing any  controlled drugs.  Clinical Course     Patient's diagnosis is consistent with allergic reaction. Patient was given Solu-Medrol, cyproheptadine, famotidine in ED. Patient will be discharged home with prescriptions for prednisone, loratadine, and ranitidine. Patient is to follow up with PCP as directed. Patient is given ED precautions to return to the ED for any worsening or new symptoms.     ____________________________________________  FINAL CLINICAL IMPRESSION(S) / ED DIAGNOSES  Final diagnoses:  Allergic reaction, initial encounter      NEW MEDICATIONS STARTED DURING THIS VISIT:  Discharge Medication List as of 02/14/2016  3:57 PM    START taking these medications   Details  predniSONE (DELTASONE) 10 MG tablet Take 4 tablets (40 mg total) by mouth daily., Starting Sat 02/14/2016, Until Thu 02/19/2016, Print            This chart was dictated using voice recognition software/Dragon. Despite best efforts to proofread, errors can occur which can change the meaning. Any  change was purely unintentional.    Laban Emperor, PA-C 02/14/16 2000    Delman Kitten, MD 02/14/16 2356

## 2016-02-14 NOTE — ED Notes (Signed)
See triage note  Developed some hive areas to hands,head and buttocks about 3 days ago  Positive itching   No resp distress noted

## 2016-02-14 NOTE — ED Triage Notes (Signed)
States rash on her hands and buttocks since Thursday, states itching and burning

## 2016-02-14 NOTE — ED Notes (Signed)
Pt alert and oriented X4, active, cooperative, pt in NAD. RR even and unlabored, color WNL.  Pt informed to return if any life threatening symptoms occur.   

## 2016-08-24 ENCOUNTER — Other Ambulatory Visit: Payer: Self-pay | Admitting: Internal Medicine

## 2016-08-24 DIAGNOSIS — Z1231 Encounter for screening mammogram for malignant neoplasm of breast: Secondary | ICD-10-CM

## 2016-09-10 ENCOUNTER — Ambulatory Visit
Admission: RE | Admit: 2016-09-10 | Discharge: 2016-09-10 | Disposition: A | Discharge status: Home / Self Care | Payer: Medicare Other | Source: Ambulatory Visit | Attending: Internal Medicine | Admitting: Internal Medicine

## 2016-09-10 DIAGNOSIS — Z1231 Encounter for screening mammogram for malignant neoplasm of breast: Secondary | ICD-10-CM

## 2017-08-05 ENCOUNTER — Other Ambulatory Visit: Payer: Self-pay | Admitting: Internal Medicine

## 2017-08-05 ENCOUNTER — Encounter: Payer: Self-pay | Admitting: Internal Medicine

## 2017-08-05 DIAGNOSIS — Z1231 Encounter for screening mammogram for malignant neoplasm of breast: Secondary | ICD-10-CM

## 2017-09-13 ENCOUNTER — Ambulatory Visit
Admission: RE | Admit: 2017-09-13 | Discharge: 2017-09-13 | Disposition: A | Discharge status: Home / Self Care | Payer: Medicare Other | Source: Ambulatory Visit | Attending: Internal Medicine | Admitting: Internal Medicine

## 2017-09-13 DIAGNOSIS — Z1231 Encounter for screening mammogram for malignant neoplasm of breast: Secondary | ICD-10-CM

## 2017-10-05 ENCOUNTER — Ambulatory Visit: Payer: Medicare Other | Admitting: Gastroenterology

## 2017-10-18 ENCOUNTER — Ambulatory Visit: Payer: Medicare Other | Admitting: Internal Medicine

## 2017-10-18 ENCOUNTER — Encounter: Payer: Self-pay | Admitting: Internal Medicine

## 2017-10-18 VITALS — BP 128/80 | HR 88 | Ht 65.06 in | Wt 181.4 lb

## 2017-10-18 DIAGNOSIS — R194 Change in bowel habit: Secondary | ICD-10-CM

## 2017-10-18 DIAGNOSIS — Z860100 Personal history of colon polyps, unspecified: Secondary | ICD-10-CM

## 2017-10-18 DIAGNOSIS — R140 Abdominal distension (gaseous): Secondary | ICD-10-CM

## 2017-10-18 DIAGNOSIS — K219 Gastro-esophageal reflux disease without esophagitis: Secondary | ICD-10-CM

## 2017-10-18 DIAGNOSIS — R1013 Epigastric pain: Secondary | ICD-10-CM

## 2017-10-18 MED ORDER — NEXIUM 40 MG PO CPDR: 40.0000 mg | DELAYED_RELEASE_CAPSULE | Freq: Every day | ORAL | 11 refills | Status: DC

## 2017-10-18 MED ORDER — NA SULFATE-K SULFATE-MG SULF 17.5-3.13-1.6 GM/177ML PO SOLN: 1.0000 | ORAL | 0 refills | Status: AC

## 2017-10-18 NOTE — Patient Instructions (Signed)
We have sent the following medications to your pharmacy for you to pick up at your convenience: nexium 40 mg daily   You have been scheduled for an endoscopy and colonoscopy. Please follow the written instructions given to you at your visit today. Please pick up your prep supplies at the pharmacy within the next 1-3 days. If you use inhalers (even only as needed), please bring them with you on the day of your procedure. Your physician has requested that you go to www.startemmi.com and enter the access code given to you at your visit today. This web site gives a general overview about your procedure. However, you should still follow specific instructions given to you by our office regarding your preparation for the procedure.

## 2017-10-18 NOTE — Progress Notes (Signed)
HISTORY OF PRESENT ILLNESS:  Mackenzie White is a 77 y.o. female who presents today with chief complaints of abdominal bloating, GERD, epigastric discomfort, alternating bowel habits, and the need for surveillance colonoscopy.The patient has not been seen in this  Office in many years. She did undergo complete colonoscopy 04/29/2005 to evaluate Hemoccult-positive stool. She was found to have several diminutive colon polyps which were removed as well as left-sided diverticulosis. Polyps were both hyperplastic and adenomatous. Follow-up in 5 years recommended. She was aware, but did not follow-up. At that same time she underwent upper endoscopy to evaluate epigastric pain, chest pain, and reflux symptoms with back pain. She was found to have multiple benign fundic gland type polyps and a hiatal hernia. No other abnormalities. Patient tells me that she has been having problems with alternating bowel habits and abdominal bloating for a few years. She placed herself on a gluten-free diet and stated that her symptoms improved dramatically. She may have had some testing which supports celiac disease, but she is not certain. I have obtained outside blood work from Dr. Loren Racer office for review. Laboratories from May 2018 reveal normal comprehensive metabolic panel including liver tests. Normal CBC with hemoglobin 14.3. Hemoglobin A1c 6.0. She tells me that her reflux symptoms had been controlled on Nexium 40 mg daily. However with multiple generic equivalent she has not had the same success. She does request a prescription for brand name Nexium. She also been using ranitidine at night. She denies bleeding or interval family history of colon cancer. Finally, she mentions tenderness in the region of the lower portion of her sternum which she has noticed off and on for months. Relevant x-rays reviewed includes negative previous operative cholangiogram.  REVIEW OF SYSTEMS:  All non-GI ROS negative except for  arthritis, back pain, urinary leakage  Past Medical History:  Diagnosis Date  . Arthritis   . Carpal tunnel syndrome of right wrist   . Depression   . Diverticulosis   . Gallstones   . GERD (gastroesophageal reflux disease)   . History of gluten intolerance   . Hyperlipemia   . Hypertension     Past Surgical History:  Procedure Laterality Date  . ABDOMINAL HYSTERECTOMY    . ACHILLES TENDON SURGERY     left   . APPENDECTOMY    . CARPAL TUNNEL RELEASE  07/23/2011   Procedure: CARPAL TUNNEL RELEASE;  Surgeon: Cammie Sickle., MD;  Location: [facility];  Service: Orthopedics;  Laterality: Right;  . CHOLECYSTECTOMY    . TUBAL LIGATION      Social History Mackenzie White  reports that she has never smoked. She has never used smokeless tobacco. She reports that she does not drink alcohol or use drugs.  family history includes Emphysema in her father; Factor V Leiden deficiency in her son; Stroke in her brother.  Allergies  Allergen Reactions  . Asa [Aspirin] Hives  . Gluten Meal   . Penicillins Swelling  . Sulfa Antibiotics Hives  . Tetracyclines & Related Hives       PHYSICAL EXAMINATION: Vital signs: BP 128/80 (BP Location: Left Arm, Patient Position: Sitting, Cuff Size: Normal)   Pulse 88   Ht 5' 5.06" (1.653 m)   Wt 181 lb 6 oz (82.3 kg)   BMI 30.13 kg/m   Constitutional: generally well-appearing, no acute distress Psychiatric: alert and oriented x3, cooperative Eyes: extraocular movements intact, anicteric, conjunctiva pink Mouth: oral pharynx moist, no lesions Neck: supple no lymphadenopathy Chest.  Tenderness with palpation of the xiphoid process which mimics the discomfort she reported previously Cardiovascular: heart regular rate and rhythm, no murmur Lungs: clear to auscultation bilaterally Abdomen: soft, nontender, nondistended, no obvious ascites, no peritoneal signs, normal bowel sounds, no organomegaly Rectal:deferred until  colonoscopy Extremities: no clubbing,Cyanosis, or lower extremity edema bilaterally Skin: no lesions on visible extremities Neuro: No focal deficits. Cranial nerves intact.  ASSESSMENT:  #1. Alternating bowel habits with bloating. Question celiac disease based on history #2. Chronic GERD. Breakthrough symptoms despite over-the-counter PPI and H2 receptor antagonist therapy #3. History of adenomatous colon polyps. Overdue for surveillance #4. Benign tenderness of the xiphoid process   PLAN:  #1. Reflux precautions #2. Prescribed Nexium 40 mg daily #3. Schedule upper endoscopy to evaluate chronic reflux and generalized abdominal discomfort and obtain duodenal biopsies to rule out celiac disease.The nature of the procedure, as well as the risks, benefits, and alternatives were carefully and thoroughly reviewed with the patient. Ample time for discussion and questions allowed. The patient understood, was satisfied, and agreed to proceed. #4. Schedule surveillance colonoscopy.The nature of the procedure, as well as the risks, benefits, and alternatives were carefully and thoroughly reviewed with the patient. Ample time for discussion and questions allowed. The patient understood, was satisfied, and agreed to proceed. #5. Reassurance regarding the xiphoid process as a normal anatomic feature

## 2017-11-23 ENCOUNTER — Encounter: Payer: Self-pay | Admitting: Internal Medicine

## 2017-12-07 ENCOUNTER — Ambulatory Visit (AMBULATORY_SURGERY_CENTER): Payer: Medicare Other | Admitting: Internal Medicine

## 2017-12-07 ENCOUNTER — Encounter: Payer: Self-pay | Admitting: Internal Medicine

## 2017-12-07 VITALS — BP 120/55 | HR 60 | Temp 97.3°F | Resp 15 | Ht 65.0 in | Wt 181.0 lb

## 2017-12-07 DIAGNOSIS — Z86010 Personal history of colonic polyps: Secondary | ICD-10-CM

## 2017-12-07 DIAGNOSIS — K317 Polyp of stomach and duodenum: Secondary | ICD-10-CM | POA: Diagnosis not present

## 2017-12-07 DIAGNOSIS — K635 Polyp of colon: Secondary | ICD-10-CM

## 2017-12-07 DIAGNOSIS — K219 Gastro-esophageal reflux disease without esophagitis: Secondary | ICD-10-CM | POA: Diagnosis not present

## 2017-12-07 DIAGNOSIS — K222 Esophageal obstruction: Secondary | ICD-10-CM | POA: Diagnosis not present

## 2017-12-07 DIAGNOSIS — D122 Benign neoplasm of ascending colon: Secondary | ICD-10-CM

## 2017-12-07 MED ORDER — SODIUM CHLORIDE 0.9 % IV SOLN: 500.0000 mL | Freq: Once | INTRAVENOUS | Status: DC

## 2017-12-07 NOTE — Op Note (Signed)
[facility] Patient Name: Arcola Freshour Procedure Date: 12/07/2017 8:29 AM MRN: [id number] Endoscopist: Docia Chuck. Henrene Pastor , MD Age: 77 Referring MD:  Date of Birth: 04/15/1940 Gender: Female Account #: [id number] Procedure:                Upper GI endoscopy with biopsies Indications:              Dyspepsia, Esophageal reflux. Question gluten                            sensitivity Medicines:                Monitored Anesthesia Care Procedure:                Pre-Anesthesia Assessment:                           - Prior to the procedure, a History and Physical                            was performed, and patient medications and                            allergies were reviewed. The patient's tolerance of                            previous anesthesia was also reviewed. The risks                            and benefits of the procedure and the sedation                            options and risks were discussed with the patient.                            All questions were answered, and informed consent                            was obtained. Prior Anticoagulants: The patient has                            taken no previous anticoagulant or antiplatelet                            agents. ASA Grade Assessment: II - A patient with                            mild systemic disease. After reviewing the risks                            and benefits, the patient was deemed in                            satisfactory condition to undergo the procedure.  After obtaining informed consent, the endoscope was                            passed under direct vision. Throughout the                            procedure, the patient's blood pressure, pulse, and                            oxygen saturations were monitored continuously. The                            Model GIF-HQ190 [id number]) scope was introduced                            through the mouth, and advanced  to the second part                            of duodenum. The upper GI endoscopy was                            accomplished without difficulty. The patient                            tolerated the procedure well. Scope In: Scope Out: Findings:                 The esophagus revealed a large caliber ring but was                            otherwise normal.                           The stomach was revealed multiple benign fundic                            gland polyps all less than 1 cm. Mild antral                            erythema.                           The examined duodenum was normal. The ampulla was                            normal. Biopsies for histology were taken with a                            cold forceps for evaluation of celiac disease.                           The cardia and gastric fundus were normal on                            retroflexion. Complications:  No immediate complications. Estimated Blood Loss:     Estimated blood loss: none. Impression:               1. GERD with incidental esophageal ring                           2. Incidental benign fundic gland type polyps                           3. Otherwise normal exam status post biopsies to                            rule out celiac disease. Recommendation:           - Patient has a contact number available for                            emergencies. The signs and symptoms of potential                            delayed complications were discussed with the                            patient. Return to normal activities tomorrow.                            Written discharge instructions were provided to the                            patient.                           - Resume previous diet.                           - Continue present medications.                           - Await pathology results. Docia Chuck. Henrene Pastor, MD 12/07/2017 9:16:27 AM This report has been signed electronically.

## 2017-12-07 NOTE — Progress Notes (Signed)
A/ox3 pleased with MAC, report to RN 

## 2017-12-07 NOTE — Progress Notes (Signed)
Called to room to assist during endoscopic procedure.  Patient ID and intended procedure confirmed with present staff. Received instructions for my participation in the procedure from the performing physician.  

## 2017-12-07 NOTE — Patient Instructions (Signed)
Continue present medications. Please read handouts on polyps, diverticulosis, and GERD.     YOU HAD AN ENDOSCOPIC PROCEDURE TODAY AT [facility]:   Refer to the procedure report that was given to you for any specific questions about what was found during the examination.  If the procedure report does not answer your questions, please call your gastroenterologist to clarify.  If you requested that your care partner not be given the details of your procedure findings, then the procedure report has been included in a sealed envelope for you to review at your convenience later.  YOU SHOULD EXPECT: Some feelings of bloating in the abdomen. Passage of more gas than usual.  Walking can help get rid of the air that was put into your GI tract during the procedure and reduce the bloating. If you had a lower endoscopy (such as a colonoscopy or flexible sigmoidoscopy) you may notice spotting of blood in your stool or on the toilet paper. If you underwent a bowel prep for your procedure, you may not have a normal bowel movement for a few days.  Please Note:  You might notice some irritation and congestion in your nose or some drainage.  This is from the oxygen used during your procedure.  There is no need for concern and it should clear up in a day or so.  SYMPTOMS TO REPORT IMMEDIATELY:   Following lower endoscopy (colonoscopy or flexible sigmoidoscopy):  Excessive amounts of blood in the stool  Significant tenderness or worsening of abdominal pains  Swelling of the abdomen that is new, acute  Fever of 100F or higher   Following upper endoscopy (EGD)  Vomiting of blood or coffee ground material  New chest pain or pain under the shoulder blades  Painful or persistently difficult swallowing  New shortness of breath  Fever of 100F or higher  Black, tarry-looking stools  For urgent or emergent issues, a gastroenterologist can be reached at any hour by calling [phone]  [phone].   DIET:  We do recommend a small meal at first, but then you may proceed to your regular diet.  Drink plenty of fluids but you should avoid alcoholic beverages for 24 hours.  ACTIVITY:  You should plan to take it easy for the rest of today and you should NOT DRIVE or use heavy machinery until tomorrow (because of the sedation medicines used during the test).    FOLLOW UP: Our staff will call the number listed on your records the next business day following your procedure to check on you and address any questions or concerns that you may have regarding the information given to you following your procedure. If we do not reach you, we will leave a message.  However, if you are feeling well and you are not experiencing any problems, there is no need to return our call.  We will assume that you have returned to your regular daily activities without incident.  If any biopsies were taken you will be contacted by phone or by letter within the next 1-3 weeks.  Please call us at [phone] if you have not heard about the biopsies in 3 weeks.    SIGNATURES/CONFIDENTIALITY: You and/or your care partner have signed paperwork which will be entered into your electronic medical record.  These signatures attest to the fact that that the information above on your After Visit Summary has been reviewed and is understood.  Full responsibility of the confidentiality of this discharge information lies with you  and/or your care-partner. 

## 2017-12-07 NOTE — Op Note (Signed)
[facility] Patient Name: Mackenzie White Procedure Date: 12/07/2017 8:29 AM MRN: [id number] Endoscopist: Docia Chuck. Henrene Pastor , MD Age: 77 Referring MD:  Date of Birth: 1940/08/16 Gender: Female Account #: [id number] Procedure:                Colonoscopy with cold snare polypectomy x 2 Indications:              High risk colon cancer surveillance: Personal                            history of non-advanced adenoma. Previous                            examinations 2003 and 2007 Medicines:                Monitored Anesthesia Care Procedure:                Pre-Anesthesia Assessment:                           - Prior to the procedure, a History and Physical                            was performed, and patient medications and                            allergies were reviewed. The patient's tolerance of                            previous anesthesia was also reviewed. The risks                            and benefits of the procedure and the sedation                            options and risks were discussed with the patient.                            All questions were answered, and informed consent                            was obtained. Prior Anticoagulants: The patient has                            taken no previous anticoagulant or antiplatelet                            agents. ASA Grade Assessment: II - A patient with                            mild systemic disease. After reviewing the risks                            and benefits, the patient was deemed in  satisfactory condition to undergo the procedure.                           After obtaining informed consent, the colonoscope                            was passed under direct vision. Throughout the                            procedure, the patient's blood pressure, pulse, and                            oxygen saturations were monitored continuously. The                            Colonoscope  was introduced through the anus and                            advanced to the the cecum, identified by                            appendiceal orifice and ileocecal valve. The                            ileocecal valve, appendiceal orifice, and rectum                            were photographed. The quality of the bowel                            preparation was excellent. The colonoscopy was                            performed without difficulty. The patient tolerated                            the procedure well. The bowel preparation used was                            SUPREP. Scope In: 8:42:37 AM Scope Out: 9:00:52 AM Scope Withdrawal Time: 0 hours 12 minutes 18 seconds  Total Procedure Duration: 0 hours 18 minutes 15 seconds  Findings:                 Two polyps were found in the ascending colon. The                            polyps were 3 mm in size. These polyps were removed                            with a cold snare. Resection and retrieval were                            complete.  Multiple diverticula were found in the sigmoid                            colon and ascending colon.                           The exam was otherwise without abnormality on                            direct and retroflexion views. Complications:            No immediate complications. Estimated blood loss:                            None. Estimated Blood Loss:     Estimated blood loss: none. Impression:               - Two 3 mm polyps in the ascending colon, removed                            with a cold snare. Resected and retrieved.                           - Diverticulosis in the sigmoid colon and in the                            ascending colon.                           - The examination was otherwise normal on direct                            and retroflexion views. Recommendation:           - Repeat colonoscopy is not recommended for                             surveillance.                           - Patient has a contact number available for                            emergencies. The signs and symptoms of potential                            delayed complications were discussed with the                            patient. Return to normal activities tomorrow.                            Written discharge instructions were provided to the                            patient.                           -  Resume previous diet.                           - Continue present medications.                           - Await pathology results. Docia Chuck. Henrene Pastor, MD 12/07/2017 9:13:02 AM This report has been signed electronically.

## 2017-12-08 ENCOUNTER — Telehealth: Payer: Self-pay | Admitting: *Deleted

## 2017-12-08 NOTE — Telephone Encounter (Signed)
  Follow up Call-  Call back number 12/07/2017  Post procedure Call Back phone  # [phone]  Permission to leave phone message Yes  Some recent data might be hidden     Patient questions:  Do you have a fever, pain , or abdominal swelling? No. Pain Score  0 *  Have you tolerated food without any problems? Yes.    Have you been able to return to your normal activities? Yes.    Do you have any questions about your discharge instructions: Diet   No. Medications  No. Follow up visit  No.  Do you have questions or concerns about your Care? No.  Actions: * If pain score is 4 or above: No action needed, pain <4.

## 2017-12-13 ENCOUNTER — Encounter: Payer: Self-pay | Admitting: Internal Medicine

## 2018-04-23 DIAGNOSIS — M1711 Unilateral primary osteoarthritis, right knee: Secondary | ICD-10-CM | POA: Insufficient documentation

## 2018-04-23 DIAGNOSIS — M170 Bilateral primary osteoarthritis of knee: Secondary | ICD-10-CM | POA: Insufficient documentation

## 2018-06-14 ENCOUNTER — Inpatient Hospital Stay: Admission: RE | Admit: 2018-06-14 | Payer: Medicare Other | Source: Ambulatory Visit

## 2018-06-28 ENCOUNTER — Inpatient Hospital Stay: Admit: 2018-06-28 | Payer: Medicare Other | Admitting: Orthopedic Surgery

## 2018-06-28 SURGERY — ARTHROPLASTY, KNEE, TOTAL, USING IMAGELESS COMPUTER-ASSISTED NAVIGATION
Anesthesia: Choice | Laterality: Left

## 2018-06-29 ENCOUNTER — Encounter
Admission: RE | Admit: 2018-06-29 | Discharge: 2018-06-29 | Disposition: A | Discharge status: Home / Self Care | Payer: Medicare Other | Source: Ambulatory Visit | Attending: Orthopedic Surgery | Admitting: Orthopedic Surgery

## 2018-06-29 ENCOUNTER — Other Ambulatory Visit: Payer: Self-pay

## 2018-06-29 DIAGNOSIS — I10 Essential (primary) hypertension: Secondary | ICD-10-CM | POA: Insufficient documentation

## 2018-06-29 DIAGNOSIS — R9431 Abnormal electrocardiogram [ECG] [EKG]: Secondary | ICD-10-CM | POA: Insufficient documentation

## 2018-06-29 DIAGNOSIS — Z1159 Encounter for screening for other viral diseases: Secondary | ICD-10-CM | POA: Insufficient documentation

## 2018-06-29 DIAGNOSIS — Z01818 Encounter for other preprocedural examination: Secondary | ICD-10-CM | POA: Diagnosis present

## 2018-06-29 HISTORY — DX: Personal history of other diseases of the digestive system: Z87.19

## 2018-06-29 HISTORY — DX: Malignant (primary) neoplasm, unspecified: C80.1

## 2018-06-29 LAB — URINALYSIS, ROUTINE W REFLEX MICROSCOPIC
Bilirubin Urine: NEGATIVE
Glucose, UA: NEGATIVE mg/dL
Hgb urine dipstick: NEGATIVE
Ketones, ur: NEGATIVE mg/dL
Leukocytes,Ua: NEGATIVE
Nitrite: NEGATIVE
Protein, ur: NEGATIVE mg/dL
Specific Gravity, Urine: 1.028 (ref 1.005–1.030)
pH: 6 (ref 5.0–8.0)

## 2018-06-29 LAB — CBC
HCT: 41.5 % (ref 36.0–46.0)
Hemoglobin: 13.4 g/dL (ref 12.0–15.0)
MCH: 27.9 pg (ref 26.0–34.0)
MCHC: 32.3 g/dL (ref 30.0–36.0)
MCV: 86.5 fL (ref 80.0–100.0)
Platelets: 313 10*3/uL (ref 150–400)
RBC: 4.8 MIL/uL (ref 3.87–5.11)
RDW: 13.9 % (ref 11.5–15.5)
WBC: 9.5 10*3/uL (ref 4.0–10.5)
nRBC: 0 % (ref 0.0–0.2)

## 2018-06-29 LAB — COMPREHENSIVE METABOLIC PANEL
ALT: 40 U/L (ref 0–44)
AST: 51 U/L — ABNORMAL HIGH (ref 15–41)
Albumin: 4.1 g/dL (ref 3.5–5.0)
Alkaline Phosphatase: 80 U/L (ref 38–126)
Anion gap: 11 (ref 5–15)
BUN: 26 mg/dL — ABNORMAL HIGH (ref 8–23)
CO2: 25 mmol/L (ref 22–32)
Calcium: 9 mg/dL (ref 8.9–10.3)
Chloride: 103 mmol/L (ref 98–111)
Creatinine, Ser: 0.95 mg/dL (ref 0.44–1.00)
GFR calc Af Amer: >60 mL/min (ref >60)
GFR calc non Af Amer: 58 mL/min — ABNORMAL LOW (ref >60)
Glucose, Bld: 104 mg/dL — ABNORMAL HIGH (ref 70–99)
Potassium: 4 mmol/L (ref 3.5–5.1)
Sodium: 139 mmol/L (ref 135–145)
Total Bilirubin: 0.6 mg/dL (ref 0.3–1.2)
Total Protein: 7.2 g/dL (ref 6.5–8.1)

## 2018-06-29 LAB — C-REACTIVE PROTEIN: CRP: 0.8 mg/dL (ref <1.0)

## 2018-06-29 LAB — SURGICAL PCR SCREEN
MRSA, PCR: NEGATIVE
Staphylococcus aureus: NEGATIVE

## 2018-06-29 LAB — APTT: aPTT: 30 seconds (ref 24–36)

## 2018-06-29 LAB — PROTIME-INR
INR: 1.1 (ref 0.8–1.2)
Prothrombin Time: 13.8 seconds (ref 11.4–15.2)

## 2018-06-29 LAB — SEDIMENTATION RATE: Sed Rate: 15 mm/hr (ref 0–30)

## 2018-06-29 NOTE — Patient Instructions (Signed)
Your procedure is scheduled on: 07-03-18 MONDAY Report to Same Day Surgery 2nd floor medical mall [facility] Entrance-take elevator on left to 2nd floor.  Check in with surgery information desk.) To find out your arrival time please call [phone] between 1PM - 3PM on 06-30-18 FRIDAY  Remember: Instructions that are not followed completely may result in serious medical risk, up to and including death, or upon the discretion of your surgeon and anesthesiologist your surgery may need to be rescheduled.    _x___ 1. Do not eat food after midnight the night before your procedure. NO GUM OR CANDY AFTER MIDNIGHT.  You may drink clear liquids up to 2 hours before you are scheduled to arrive at the hospital for your procedure.  Do not drink clear liquids within 2 hours of your scheduled arrival to the hospital.  Clear liquids include  --Water or Apple juice without pulp  --Clear carbohydrate beverage such as ClearFast or Gatorade  --Black Coffee or Clear Tea (No milk, no creamers, do not add anything to the coffee or Tea   ____Ensure clear carbohydrate drink on the way to the hospital for bariatric patients  _X___Ensure clear carbohydrate drink 3 hours before surgery     __x__ 2. No Alcohol for 24 hours before or after surgery.   __x__3. No Smoking or e-cigarettes for 24 prior to surgery.  Do not use any chewable tobacco products for at least 6 hour prior to surgery   ____  4. Bring all medications with you on the day of surgery if instructed.    __x__ 5. Notify your doctor if there is any change in your medical condition     (cold, fever, infections).    x___6. On the morning of surgery brush your teeth with toothpaste and water.  You may rinse your mouth with mouth wash if you wish.  Do not swallow any toothpaste or mouthwash.   Do not wear jewelry, make-up, hairpins, clips or nail polish.  Do not wear lotions, powders, or perfumes. You may wear deodorant.  Do not shave 48 hours prior  to surgery. Men may shave face and neck.  Do not bring valuables to the hospital.    [facility] is not responsible for any belongings or valuables.               Contacts, dentures or bridgework may not be worn into surgery.  Leave your suitcase in the car. After surgery it may be brought to your room.  For patients admitted to the hospital, discharge time is determined by your treatment team.  _  Patients discharged the day of surgery will not be allowed to drive home.  You will need someone to drive you home and stay with you the night of your procedure.    Please read over the following fact sheets that you were given:   [facility] Preparing for Surgery and or MRSA Information/INCENTIVE SPIROMETER   _x___ TAKE THE FOLLOWING MEDICATION THE MORNING OF SURGERY WITH A SMALL SIP OF WATER. These include:  1. CYMBALTA (DULOXETINE)  2. [place]   3. TAKE AN EXTRA NEXIUM THE NIGHT BEFORE YOUR SURGERY  4. YOU MAY TAKE ALPRAZOLAM (XANAX) THE MORNING OF SURGERY IF NEEDED  5.  6.  ____Fleets enema or Magnesium Citrate as directed.   _x___ Use CHG Soap or sage wipes as directed on instruction sheet   ____ Use inhalers on the day of surgery and bring to hospital day of surgery  ____ Stop  Metformin and Janumet 2 days prior to surgery.    ____ Take 1/2 of usual insulin dose the night before surgery and none on the morning surgery.   ____ Follow recommendations from Cardiologist, Pulmonologist or PCP regarding stopping Aspirin, Coumadin, Plavix ,Eliquis, Effient, or Pradaxa, and Pletal.  X____Stop Anti-inflammatories such as Advil, Aleve, Ibuprofen, Motrin, Naproxen, Naprosyn, Goodies powders or aspirin products NOW-OK to take Tylenol    ____ Stop supplements until after surgery.   ____ Bring C-Pap to the hospital.

## 2018-06-30 ENCOUNTER — Other Ambulatory Visit
Admission: RE | Admit: 2018-06-30 | Discharge: 2018-06-30 | Disposition: A | Discharge status: Home / Self Care | Payer: Medicare Other | Source: Ambulatory Visit | Attending: Orthopedic Surgery | Admitting: Orthopedic Surgery

## 2018-06-30 ENCOUNTER — Other Ambulatory Visit: Payer: Medicare Other

## 2018-06-30 DIAGNOSIS — Z01818 Encounter for other preprocedural examination: Secondary | ICD-10-CM | POA: Diagnosis not present

## 2018-06-30 LAB — URINE CULTURE
Culture: <10000 — AB
Special Requests: NORMAL

## 2018-06-30 LAB — TYPE AND SCREEN
ABO/RH(D): A NEG
Antibody Screen: NEGATIVE

## 2018-07-01 LAB — NOVEL CORONAVIRUS, NAA (HOSP ORDER, SEND-OUT TO REF LAB; TAT 18-24 HRS): SARS-CoV-2, NAA: NOT DETECTED

## 2018-07-03 ENCOUNTER — Inpatient Hospital Stay: Payer: Medicare Other

## 2018-07-03 ENCOUNTER — Encounter
Admission: RE | Disposition: A | Discharge status: Home Health | Payer: Self-pay | Source: Home / Self Care | Attending: Orthopedic Surgery

## 2018-07-03 ENCOUNTER — Inpatient Hospital Stay: Payer: Medicare Other | Admitting: Anesthesiology

## 2018-07-03 ENCOUNTER — Encounter: Payer: Self-pay | Admitting: Orthopedic Surgery

## 2018-07-03 ENCOUNTER — Other Ambulatory Visit: Payer: Self-pay

## 2018-07-03 ENCOUNTER — Inpatient Hospital Stay
Admission: RE | Admit: 2018-07-03 | Discharge: 2018-07-05 | DRG: 470 | Disposition: A | Discharge status: Home Health | Payer: Medicare Other | Attending: Orthopedic Surgery | Admitting: Orthopedic Surgery

## 2018-07-03 DIAGNOSIS — M1712 Unilateral primary osteoarthritis, left knee: Principal | ICD-10-CM | POA: Diagnosis present

## 2018-07-03 DIAGNOSIS — Z96652 Presence of left artificial knee joint: Secondary | ICD-10-CM

## 2018-07-03 DIAGNOSIS — Z882 Allergy status to sulfonamides status: Secondary | ICD-10-CM

## 2018-07-03 DIAGNOSIS — K219 Gastro-esophageal reflux disease without esophagitis: Secondary | ICD-10-CM | POA: Diagnosis present

## 2018-07-03 DIAGNOSIS — Z96659 Presence of unspecified artificial knee joint: Secondary | ICD-10-CM

## 2018-07-03 DIAGNOSIS — Z79899 Other long term (current) drug therapy: Secondary | ICD-10-CM

## 2018-07-03 DIAGNOSIS — Z886 Allergy status to analgesic agent status: Secondary | ICD-10-CM | POA: Diagnosis not present

## 2018-07-03 DIAGNOSIS — F32A Depression, unspecified: Secondary | ICD-10-CM | POA: Insufficient documentation

## 2018-07-03 DIAGNOSIS — Z90710 Acquired absence of both cervix and uterus: Secondary | ICD-10-CM

## 2018-07-03 DIAGNOSIS — I10 Essential (primary) hypertension: Secondary | ICD-10-CM | POA: Diagnosis present

## 2018-07-03 DIAGNOSIS — Z9049 Acquired absence of other specified parts of digestive tract: Secondary | ICD-10-CM

## 2018-07-03 DIAGNOSIS — K9041 Non-celiac gluten sensitivity: Secondary | ICD-10-CM | POA: Diagnosis present

## 2018-07-03 DIAGNOSIS — E785 Hyperlipidemia, unspecified: Secondary | ICD-10-CM | POA: Diagnosis present

## 2018-07-03 DIAGNOSIS — F329 Major depressive disorder, single episode, unspecified: Secondary | ICD-10-CM | POA: Diagnosis present

## 2018-07-03 DIAGNOSIS — Z791 Long term (current) use of non-steroidal anti-inflammatories (NSAID): Secondary | ICD-10-CM

## 2018-07-03 DIAGNOSIS — Z880 Allergy status to penicillin: Secondary | ICD-10-CM

## 2018-07-03 DIAGNOSIS — Z888 Allergy status to other drugs, medicaments and biological substances status: Secondary | ICD-10-CM | POA: Diagnosis not present

## 2018-07-03 HISTORY — PX: KNEE ARTHROPLASTY: SHX992

## 2018-07-03 LAB — ABO/RH: ABO/RH(D): A NEG

## 2018-07-03 SURGERY — ARTHROPLASTY, KNEE, TOTAL, USING IMAGELESS COMPUTER-ASSISTED NAVIGATION
Anesthesia: Spinal | Laterality: Left

## 2018-07-03 MED ORDER — ACETAMINOPHEN 325 MG PO TABS
325.0000 mg | ORAL_TABLET | Freq: Four times a day (QID) | ORAL | Status: DC | PRN
  Administered 2018-07-05: 650 mg via ORAL
  Filled 2018-07-03: qty 2

## 2018-07-03 MED ORDER — METOCLOPRAMIDE HCL 5 MG PO TABS: 5.0000 mg | ORAL_TABLET | Freq: Three times a day (TID) | ORAL | Status: DC | PRN

## 2018-07-03 MED ORDER — DEXAMETHASONE SODIUM PHOSPHATE 10 MG/ML IJ SOLN
8.0000 mg | Freq: Once | INTRAMUSCULAR | Status: AC
  Administered 2018-07-03: 8 mg via INTRAVENOUS

## 2018-07-03 MED ORDER — MAGNESIUM HYDROXIDE 400 MG/5ML PO SUSP
30.0000 mL | Freq: Every day | ORAL | Status: DC
  Administered 2018-07-04 – 2018-07-05 (×2): 30 mL via ORAL
  Filled 2018-07-03 (×3): qty 30

## 2018-07-03 MED ORDER — PHENYLEPHRINE HCL (PRESSORS) 10 MG/ML IV SOLN
INTRAVENOUS | Status: AC
  Filled 2018-07-03: qty 1

## 2018-07-03 MED ORDER — PROPOFOL 500 MG/50ML IV EMUL
INTRAVENOUS | Status: AC
  Filled 2018-07-03: qty 50

## 2018-07-03 MED ORDER — FLEET ENEMA 7-19 GM/118ML RE ENEM: 1.0000 | ENEMA | Freq: Once | RECTAL | Status: DC | PRN

## 2018-07-03 MED ORDER — ALPRAZOLAM 0.25 MG PO TABS: 0.2500 mg | ORAL_TABLET | Freq: Every day | ORAL | Status: DC | PRN

## 2018-07-03 MED ORDER — CELECOXIB 200 MG PO CAPS
ORAL_CAPSULE | ORAL | Status: AC
  Administered 2018-07-03: 400 mg via ORAL
  Filled 2018-07-03: qty 2

## 2018-07-03 MED ORDER — PROPOFOL 500 MG/50ML IV EMUL
INTRAVENOUS | Status: DC | PRN
  Administered 2018-07-03: 75 ug/kg/min via INTRAVENOUS

## 2018-07-03 MED ORDER — PHENOL 1.4 % MT LIQD
1.0000 | OROMUCOSAL | Status: DC | PRN
  Filled 2018-07-03: qty 177

## 2018-07-03 MED ORDER — HYDROCHLOROTHIAZIDE 12.5 MG PO CAPS
12.5000 mg | ORAL_CAPSULE | Freq: Every day | ORAL | Status: DC
  Administered 2018-07-05: 12.5 mg via ORAL
  Filled 2018-07-03: qty 1

## 2018-07-03 MED ORDER — MIDAZOLAM HCL 5 MG/5ML IJ SOLN
INTRAMUSCULAR | Status: DC | PRN
  Administered 2018-07-03: 2 mg via INTRAVENOUS

## 2018-07-03 MED ORDER — SODIUM CHLORIDE FLUSH 0.9 % IV SOLN
INTRAVENOUS | Status: AC
  Filled 2018-07-03: qty 40

## 2018-07-03 MED ORDER — PANTOPRAZOLE SODIUM 40 MG PO TBEC
40.0000 mg | DELAYED_RELEASE_TABLET | Freq: Two times a day (BID) | ORAL | Status: DC
  Administered 2018-07-03 – 2018-07-05 (×4): 40 mg via ORAL
  Filled 2018-07-03 (×4): qty 1

## 2018-07-03 MED ORDER — ACETAMINOPHEN 10 MG/ML IV SOLN
1000.0000 mg | Freq: Four times a day (QID) | INTRAVENOUS | Status: AC
  Administered 2018-07-03 – 2018-07-04 (×4): 1000 mg via INTRAVENOUS
  Filled 2018-07-03 (×4): qty 100

## 2018-07-03 MED ORDER — SODIUM CHLORIDE 0.9 % IV SOLN
INTRAVENOUS | Status: DC | PRN
  Administered 2018-07-03: 25 ug/min via INTRAVENOUS

## 2018-07-03 MED ORDER — FERROUS SULFATE 325 (65 FE) MG PO TABS
325.0000 mg | ORAL_TABLET | Freq: Two times a day (BID) | ORAL | Status: DC
  Administered 2018-07-03 – 2018-07-05 (×4): 325 mg via ORAL
  Filled 2018-07-03 (×4): qty 1

## 2018-07-03 MED ORDER — CELECOXIB 200 MG PO CAPS
400.0000 mg | ORAL_CAPSULE | Freq: Once | ORAL | Status: AC
  Administered 2018-07-03: 400 mg via ORAL

## 2018-07-03 MED ORDER — DIPHENHYDRAMINE HCL 12.5 MG/5ML PO ELIX
12.5000 mg | ORAL_SOLUTION | ORAL | Status: DC | PRN
  Filled 2018-07-03: qty 10

## 2018-07-03 MED ORDER — DEXAMETHASONE SODIUM PHOSPHATE 10 MG/ML IJ SOLN
INTRAMUSCULAR | Status: AC
  Administered 2018-07-03: 8 mg via INTRAVENOUS
  Filled 2018-07-03: qty 1

## 2018-07-03 MED ORDER — PROPOFOL 10 MG/ML IV BOLUS
INTRAVENOUS | Status: AC
  Filled 2018-07-03: qty 40

## 2018-07-03 MED ORDER — FLUTICASONE PROPIONATE 50 MCG/ACT NA SUSP
1.0000 | Freq: Every day | NASAL | Status: DC | PRN
  Filled 2018-07-03: qty 16

## 2018-07-03 MED ORDER — NEOMYCIN-POLYMYXIN B GU 40-200000 IR SOLN
Status: AC
  Filled 2018-07-03: qty 20

## 2018-07-03 MED ORDER — CELECOXIB 200 MG PO CAPS
200.0000 mg | ORAL_CAPSULE | Freq: Two times a day (BID) | ORAL | Status: DC
  Administered 2018-07-03 – 2018-07-05 (×4): 200 mg via ORAL
  Filled 2018-07-03 (×5): qty 1

## 2018-07-03 MED ORDER — LISINOPRIL 20 MG PO TABS
20.0000 mg | ORAL_TABLET | Freq: Every day | ORAL | Status: DC
  Administered 2018-07-05: 08:00:00 20 mg via ORAL
  Filled 2018-07-03: qty 1

## 2018-07-03 MED ORDER — GABAPENTIN 300 MG PO CAPS
300.0000 mg | ORAL_CAPSULE | Freq: Every day | ORAL | Status: DC
  Administered 2018-07-03 – 2018-07-04 (×2): 300 mg via ORAL
  Filled 2018-07-03 (×2): qty 1

## 2018-07-03 MED ORDER — CLINDAMYCIN PHOSPHATE 900 MG/50ML IV SOLN
INTRAVENOUS | Status: AC
  Filled 2018-07-03: qty 50

## 2018-07-03 MED ORDER — GENTAMICIN SULFATE 40 MG/ML IJ SOLN
INTRAMUSCULAR | Status: AC
  Filled 2018-07-03: qty 8

## 2018-07-03 MED ORDER — CHLORHEXIDINE GLUCONATE 4 % EX LIQD: 60.0000 mL | Freq: Once | CUTANEOUS | Status: DC

## 2018-07-03 MED ORDER — TRANEXAMIC ACID-NACL 1000-0.7 MG/100ML-% IV SOLN
1000.0000 mg | INTRAVENOUS | Status: AC
  Administered 2018-07-03: 1000 mg via INTRAVENOUS
  Filled 2018-07-03: qty 100

## 2018-07-03 MED ORDER — SODIUM CHLORIDE 0.9 % IV SOLN
INTRAVENOUS | Status: DC
  Administered 2018-07-03 – 2018-07-04 (×3): via INTRAVENOUS

## 2018-07-03 MED ORDER — BUPIVACAINE LIPOSOME 1.3 % IJ SUSP
INTRAMUSCULAR | Status: AC
  Filled 2018-07-03: qty 20

## 2018-07-03 MED ORDER — ALUM & MAG HYDROXIDE-SIMETH 200-200-20 MG/5ML PO SUSP: 30.0000 mL | ORAL | Status: DC | PRN

## 2018-07-03 MED ORDER — ONDANSETRON HCL 4 MG/2ML IJ SOLN: 4.0000 mg | Freq: Four times a day (QID) | INTRAMUSCULAR | Status: DC | PRN

## 2018-07-03 MED ORDER — ENOXAPARIN SODIUM 30 MG/0.3ML ~~LOC~~ SOLN
30.0000 mg | Freq: Two times a day (BID) | SUBCUTANEOUS | Status: DC
  Administered 2018-07-04 – 2018-07-05 (×3): 30 mg via SUBCUTANEOUS
  Filled 2018-07-03 (×3): qty 0.3

## 2018-07-03 MED ORDER — METOCLOPRAMIDE HCL 5 MG PO TABS
10.0000 mg | ORAL_TABLET | Freq: Three times a day (TID) | ORAL | Status: DC
  Administered 2018-07-03 – 2018-07-05 (×7): 10 mg via ORAL
  Filled 2018-07-03 (×7): qty 2

## 2018-07-03 MED ORDER — HYDROMORPHONE HCL 1 MG/ML IJ SOLN: 0.5000 mg | INTRAMUSCULAR | Status: DC | PRN

## 2018-07-03 MED ORDER — BUPIVACAINE HCL (PF) 0.25 % IJ SOLN
INTRAMUSCULAR | Status: AC
  Filled 2018-07-03: qty 60

## 2018-07-03 MED ORDER — TRANEXAMIC ACID-NACL 1000-0.7 MG/100ML-% IV SOLN
1000.0000 mg | Freq: Once | INTRAVENOUS | Status: AC
  Administered 2018-07-03: 1000 mg via INTRAVENOUS
  Filled 2018-07-03: qty 100

## 2018-07-03 MED ORDER — LISINOPRIL-HYDROCHLOROTHIAZIDE 20-12.5 MG PO TABS: 1.0000 | ORAL_TABLET | Freq: Every day | ORAL | Status: DC

## 2018-07-03 MED ORDER — SENNOSIDES-DOCUSATE SODIUM 8.6-50 MG PO TABS
1.0000 | ORAL_TABLET | Freq: Two times a day (BID) | ORAL | Status: DC
  Administered 2018-07-03 – 2018-07-05 (×4): 1 via ORAL
  Filled 2018-07-03 (×4): qty 1

## 2018-07-03 MED ORDER — TETRACAINE HCL 1 % IJ SOLN
INTRAMUSCULAR | Status: DC | PRN
  Administered 2018-07-03: 5 mg via INTRASPINAL

## 2018-07-03 MED ORDER — CLINDAMYCIN PHOSPHATE 900 MG/50ML IV SOLN
900.0000 mg | INTRAVENOUS | Status: AC
  Administered 2018-07-03: 12:00:00 900 mg via INTRAVENOUS

## 2018-07-03 MED ORDER — ACETAMINOPHEN 10 MG/ML IV SOLN
INTRAVENOUS | Status: AC
  Filled 2018-07-03: qty 100

## 2018-07-03 MED ORDER — TETRACAINE HCL 1 % IJ SOLN
INTRAMUSCULAR | Status: AC
  Filled 2018-07-03: qty 2

## 2018-07-03 MED ORDER — BUPIVACAINE HCL (PF) 0.5 % IJ SOLN
INTRAMUSCULAR | Status: DC | PRN
  Administered 2018-07-03: 2.5 mL

## 2018-07-03 MED ORDER — CLINDAMYCIN PHOSPHATE 600 MG/50ML IV SOLN
600.0000 mg | Freq: Four times a day (QID) | INTRAVENOUS | Status: AC
  Administered 2018-07-03 – 2018-07-04 (×4): 600 mg via INTRAVENOUS
  Filled 2018-07-03 (×4): qty 50

## 2018-07-03 MED ORDER — OXYCODONE HCL 5 MG PO TABS
5.0000 mg | ORAL_TABLET | ORAL | Status: DC | PRN
  Administered 2018-07-03 (×2): 5 mg via ORAL
  Filled 2018-07-03 (×2): qty 1

## 2018-07-03 MED ORDER — RISAQUAD PO CAPS
1.0000 | ORAL_CAPSULE | Freq: Every day | ORAL | Status: DC
  Administered 2018-07-04 – 2018-07-05 (×2): 1 via ORAL
  Filled 2018-07-03 (×2): qty 1

## 2018-07-03 MED ORDER — SODIUM CHLORIDE 0.9 % IV SOLN
INTRAVENOUS | Status: DC | PRN
  Administered 2018-07-03: 60 mL

## 2018-07-03 MED ORDER — BUPIVACAINE HCL (PF) 0.5 % IJ SOLN
INTRAMUSCULAR | Status: AC
  Filled 2018-07-03: qty 10

## 2018-07-03 MED ORDER — BISACODYL 10 MG RE SUPP
10.0000 mg | Freq: Every day | RECTAL | Status: DC | PRN
  Administered 2018-07-05: 11:00:00 10 mg via RECTAL
  Filled 2018-07-03: qty 1

## 2018-07-03 MED ORDER — NEOMYCIN-POLYMYXIN B GU 40-200000 IR SOLN
Status: DC | PRN
  Administered 2018-07-03: 14 mL

## 2018-07-03 MED ORDER — TRAMADOL HCL 50 MG PO TABS
50.0000 mg | ORAL_TABLET | ORAL | Status: DC | PRN
  Administered 2018-07-04: 100 mg via ORAL
  Administered 2018-07-04 (×2): 50 mg via ORAL
  Administered 2018-07-05 (×2): 100 mg via ORAL
  Filled 2018-07-03 (×3): qty 2
  Filled 2018-07-03 (×2): qty 1

## 2018-07-03 MED ORDER — ATORVASTATIN CALCIUM 20 MG PO TABS
40.0000 mg | ORAL_TABLET | Freq: Every day | ORAL | Status: DC
  Administered 2018-07-04: 21:00:00 40 mg via ORAL
  Filled 2018-07-03 (×2): qty 2

## 2018-07-03 MED ORDER — LACTATED RINGERS IV SOLN
INTRAVENOUS | Status: DC | PRN
  Administered 2018-07-03 (×2): via INTRAVENOUS

## 2018-07-03 MED ORDER — CALCIUM CARBONATE-VITAMIN D 500-200 MG-UNIT PO TABS
1.0000 | ORAL_TABLET | Freq: Every day | ORAL | Status: DC
  Administered 2018-07-04 – 2018-07-05 (×2): 1 via ORAL
  Filled 2018-07-03 (×2): qty 1

## 2018-07-03 MED ORDER — METOCLOPRAMIDE HCL 5 MG/ML IJ SOLN: 5.0000 mg | Freq: Three times a day (TID) | INTRAMUSCULAR | Status: DC | PRN

## 2018-07-03 MED ORDER — DIPHENHYDRAMINE HCL 25 MG PO CAPS
25.0000 mg | ORAL_CAPSULE | Freq: Every day | ORAL | Status: DC
  Administered 2018-07-03 – 2018-07-04 (×2): 25 mg via ORAL
  Filled 2018-07-03 (×2): qty 1

## 2018-07-03 MED ORDER — SIMETHICONE 80 MG PO CHEW
80.0000 mg | CHEWABLE_TABLET | Freq: Four times a day (QID) | ORAL | Status: DC | PRN
  Filled 2018-07-03: qty 1

## 2018-07-03 MED ORDER — OXYCODONE HCL 5 MG PO TABS: 10.0000 mg | ORAL_TABLET | ORAL | Status: DC | PRN

## 2018-07-03 MED ORDER — LACTATED RINGERS IV SOLN
INTRAVENOUS | Status: DC
  Administered 2018-07-03: 10:00:00 via INTRAVENOUS

## 2018-07-03 MED ORDER — MENTHOL 3 MG MT LOZG
1.0000 | LOZENGE | OROMUCOSAL | Status: DC | PRN
  Filled 2018-07-03: qty 9

## 2018-07-03 MED ORDER — BUPIVACAINE HCL (PF) 0.25 % IJ SOLN
INTRAMUSCULAR | Status: DC | PRN
  Administered 2018-07-03: 60 mL

## 2018-07-03 MED ORDER — ONDANSETRON HCL 4 MG PO TABS: 4.0000 mg | ORAL_TABLET | Freq: Four times a day (QID) | ORAL | Status: DC | PRN

## 2018-07-03 MED ORDER — MIDAZOLAM HCL 2 MG/2ML IJ SOLN
INTRAMUSCULAR | Status: AC
  Filled 2018-07-03: qty 2

## 2018-07-03 MED ORDER — DULOXETINE HCL 30 MG PO CPEP
60.0000 mg | ORAL_CAPSULE | ORAL | Status: DC
  Administered 2018-07-04 – 2018-07-05 (×2): 60 mg via ORAL
  Filled 2018-07-03 (×2): qty 2

## 2018-07-03 MED ORDER — GABAPENTIN 300 MG PO CAPS
300.0000 mg | ORAL_CAPSULE | Freq: Once | ORAL | Status: AC
  Administered 2018-07-03: 300 mg via ORAL

## 2018-07-03 MED ORDER — ACETAMINOPHEN 10 MG/ML IV SOLN
INTRAVENOUS | Status: DC | PRN
  Administered 2018-07-03: 1000 mg via INTRAVENOUS

## 2018-07-03 MED ORDER — GABAPENTIN 300 MG PO CAPS
ORAL_CAPSULE | ORAL | Status: AC
  Administered 2018-07-03: 300 mg via ORAL
  Filled 2018-07-03: qty 1

## 2018-07-03 SURGICAL SUPPLY — 71 items
ATTUNE MED DOME PAT 38 KNEE (Knees) ×1 IMPLANT
ATTUNE MED DOME PAT 38MM KNEE (Knees) ×1 IMPLANT
ATTUNE PSFEM LTSZ5 NARCEM KNEE (Femur) ×2 IMPLANT
ATTUNE PSRP INSR SZ5 5 KNEE (Insert) ×1 IMPLANT
ATTUNE PSRP INSR SZ5 5MM KNEE (Insert) ×1 IMPLANT
BASEPLATE TIBIAL ROTATING SZ 4 (Knees) ×2 IMPLANT
BATTERY INSTRU NAVIGATION (MISCELLANEOUS) ×12 IMPLANT
BLADE SAW 70X12.5 (BLADE) ×3 IMPLANT
BLADE SAW 90X13X1.19 OSCILLAT (BLADE) ×3 IMPLANT
BLADE SAW 90X25X1.19 OSCILLAT (BLADE) ×3 IMPLANT
BSPLAT TIB 4 CMNT ROT PLAT STR (Knees) ×1 IMPLANT
BTRY SRG DRVR LF (MISCELLANEOUS) ×4
CANISTER SUCT 3000ML PPV (MISCELLANEOUS) ×3 IMPLANT
CEMENT HV SMART SET (Cement) ×6 IMPLANT
COOLER POLAR GLACIER W/PUMP (MISCELLANEOUS) ×3 IMPLANT
COVER WAND RF STERILE (DRAPES) ×3 IMPLANT
CUFF TOURN SGL QUICK 24 (TOURNIQUET CUFF)
CUFF TOURN SGL QUICK 30 (TOURNIQUET CUFF) ×3
CUFF TRNQT CYL 24X4X16.5-23 (TOURNIQUET CUFF) IMPLANT
CUFF TRNQT CYL 30X4X21-28X (TOURNIQUET CUFF) IMPLANT
DRAPE SHEET LG 3/4 BI-LAMINATE (DRAPES) ×3 IMPLANT
DRSG DERMACEA 8X12 NADH (GAUZE/BANDAGES/DRESSINGS) ×3 IMPLANT
DRSG OPSITE POSTOP 4X14 (GAUZE/BANDAGES/DRESSINGS) ×3 IMPLANT
DRSG TEGADERM 4X4.75 (GAUZE/BANDAGES/DRESSINGS) ×3 IMPLANT
DURAPREP 26ML APPLICATOR (WOUND CARE) ×4 IMPLANT
ELECT REM PT RETURN 9FT ADLT (ELECTROSURGICAL) ×3
ELECTRODE REM PT RTRN 9FT ADLT (ELECTROSURGICAL) ×1 IMPLANT
EX-PIN ORTHOLOCK NAV 4X150 (PIN) ×6 IMPLANT
GLOVE BIOGEL M STRL SZ7.5 (GLOVE) ×6 IMPLANT
GLOVE INDICATOR 8.0 STRL GRN (GLOVE) ×3 IMPLANT
GOWN STRL REUS W/ TWL LRG LVL3 (GOWN DISPOSABLE) ×2 IMPLANT
GOWN STRL REUS W/TWL LRG LVL3 (GOWN DISPOSABLE) ×12
HEMOVAC 400CC 10FR (MISCELLANEOUS) ×3 IMPLANT
HOLDER FOLEY CATH W/STRAP (MISCELLANEOUS) ×3 IMPLANT
HOOD PEEL AWAY FLYTE STAYCOOL (MISCELLANEOUS) ×8 IMPLANT
KIT TURNOVER KIT A (KITS) ×3 IMPLANT
KNIFE SCULPS 14X20 (INSTRUMENTS) ×3 IMPLANT
LABEL OR SOLS (LABEL) ×3 IMPLANT
MANIFOLD NEPTUNE WASTE (CANNULA) ×3 IMPLANT
NDL SAFETY ECLIPSE 18X1.5 (NEEDLE) ×1 IMPLANT
NDL SPNL 20GX3.5 QUINCKE YW (NEEDLE) ×2 IMPLANT
NEEDLE HYPO 18GX1.5 SHARP (NEEDLE) ×3
NEEDLE SPNL 20GX3.5 QUINCKE YW (NEEDLE) ×6 IMPLANT
NS IRRIG 500ML POUR BTL (IV SOLUTION) ×3 IMPLANT
PACK TOTAL KNEE (MISCELLANEOUS) ×3 IMPLANT
PAD WRAPON POLAR KNEE (MISCELLANEOUS) ×1 IMPLANT
PENCIL SMOKE ULTRAEVAC 22 CON (MISCELLANEOUS) ×3 IMPLANT
PIN DRILL QUICK PACK ×3 IMPLANT
PIN FIXATION 1/8DIA X 3INL (PIN) ×9 IMPLANT
PULSAVAC PLUS IRRIG FAN TIP (DISPOSABLE) ×3
SOL .9 NS 3000ML IRR  AL (IV SOLUTION) ×2
SOL .9 NS 3000ML IRR AL (IV SOLUTION) ×1
SOL .9 NS 3000ML IRR UROMATIC (IV SOLUTION) ×1 IMPLANT
SOL PREP PVP 2OZ (MISCELLANEOUS) ×3
SOLUTION PREP PVP 2OZ (MISCELLANEOUS) ×1 IMPLANT
SPONGE DRAIN TRACH 4X4 STRL 2S (GAUZE/BANDAGES/DRESSINGS) ×3 IMPLANT
STAPLER SKIN PROX 35W (STAPLE) ×3 IMPLANT
STOCKINETTE IMPERV 14X48 (MISCELLANEOUS) IMPLANT
STRAP TIBIA SHORT (MISCELLANEOUS) ×3 IMPLANT
SUCTION FRAZIER HANDLE 10FR (MISCELLANEOUS) ×2
SUCTION TUBE FRAZIER 10FR DISP (MISCELLANEOUS) ×1 IMPLANT
SUT VIC AB 0 CT1 36 (SUTURE) ×5 IMPLANT
SUT VIC AB 1 CT1 36 (SUTURE) ×6 IMPLANT
SUT VIC AB 2-0 CT2 27 (SUTURE) ×3 IMPLANT
SYR 20CC LL (SYRINGE) ×3 IMPLANT
SYR 30ML LL (SYRINGE) ×6 IMPLANT
TIP FAN IRRIG PULSAVAC PLUS (DISPOSABLE) ×1 IMPLANT
TOWEL OR 17X26 4PK STRL BLUE (TOWEL DISPOSABLE) ×1 IMPLANT
TOWER CARTRIDGE SMART MIX (DISPOSABLE) ×3 IMPLANT
TRAY FOLEY MTR SLVR 16FR STAT (SET/KITS/TRAYS/PACK) ×3 IMPLANT
WRAPON POLAR PAD KNEE (MISCELLANEOUS) ×3

## 2018-07-03 NOTE — Anesthesia Preprocedure Evaluation (Signed)
Anesthesia Evaluation  Patient identified by MRN, date of birth, ID band Patient awake    Reviewed: Allergy & Precautions, NPO status , Patient's Chart, lab work & pertinent test results  History of Anesthesia Complications Negative for: history of anesthetic complications  Airway Mallampati: II  TM Distance: >3 FB Neck ROM: Full    Dental no notable dental hx.    Pulmonary neg pulmonary ROS, neg sleep apnea, neg COPD,    breath sounds clear to auscultation- rhonchi (-) wheezing      Cardiovascular Exercise Tolerance: Good hypertension, Pt. on medications (-) CAD, (-) Past MI, (-) Cardiac Stents and (-) CABG  Rhythm:Regular Rate:Normal - Systolic murmurs and - Diastolic murmurs    Neuro/Psych neg Seizures PSYCHIATRIC DISORDERS Depression    GI/Hepatic Neg liver ROS, hiatal hernia, GERD  ,  Endo/Other  negative endocrine ROSneg diabetes  Renal/GU negative Renal ROS     Musculoskeletal  (+) Arthritis ,   Abdominal (+) - obese,   Peds  Hematology negative hematology ROS (+)   Anesthesia Other Findings Past Medical History: No date: Allergy No date: Arthritis No date: Cancer [facility])     Comment:  basal cell skin cancer No date: Carpal tunnel syndrome of right wrist No date: Cataract     Comment:  bilateral eyes in 2015 No date: Depression No date: Diverticulosis No date: Gallstones No date: GERD (gastroesophageal reflux disease) No date: History of gluten intolerance No date: History of hiatal hernia     Comment:  big per pt No date: Hyperlipemia No date: Hypertension   Reproductive/Obstetrics                             Lab Results  Component Value Date   WBC 9.5 06/29/2018   HGB 13.4 06/29/2018   HCT 41.5 06/29/2018   MCV 86.5 06/29/2018   PLT 313 06/29/2018    Anesthesia Physical Anesthesia Plan  ASA: II  Anesthesia Plan: Spinal   Post-op Pain Management:     Induction:   PONV Risk Score and Plan: 2 and Propofol infusion  Airway Management Planned: Natural Airway  Additional Equipment:   Intra-op Plan:   Post-operative Plan:   Informed Consent: I have reviewed the patients History and Physical, chart, labs and discussed the procedure including the risks, benefits and alternatives for the proposed anesthesia with the patient or authorized representative who has indicated his/her understanding and acceptance.     Dental advisory given  Plan Discussed with: CRNA and Anesthesiologist  Anesthesia Plan Comments:         Anesthesia Quick Evaluation

## 2018-07-03 NOTE — Evaluation (Signed)
Physical Therapy Evaluation Patient Details Name: Mackenzie White MRN: [id number] DOB: Jun 01, 1940 Today's Date: 07/03/2018   History of Present Illness  Mackenzie White is a 78 y.o. female admitted to the hospital s/p L TKA performed 9/56/3875 with no complications. Relevant PMI includes bilateral knee OA (pt states R needs TKA as well but currently doing well), GERD, HTN, depression.     Clinical Impression  Patient is A&Ox4 and able to provide a detailed history. Prior to hospitalization she was I with all aspects of care and mobility, although B knee pain was preventing her from working at her part time job at [employer]. She lives alone in a 1 story home with a ramped entrance with no handrail. She has grab bars in her walk in shower and attached to her toilet under the seat. Upon physical therapy evaluation, patient required supervision and cuing for supine to sit bed mobility, was able to perform STS transfer using RW from slightly elevated bed to armed chair with CGA and cuing for hand placement, forward shift, body and AD positioning. She ambulate 140 feet with RW and CGA to supervision. She demo decreased weight bearing and heel strike on LLE, short R step length, and slightly stooped posture. No evidence of buckling. She tolerated today's PT session well. Patient has experienced a decline in functional independence from PLOF and would would benefit from physical therapy to address impairments and functional limitations (see PT problem list below) to work towards return to PLOF or maximal functional independence.     Follow Up Recommendations Home health PT;Supervision - Intermittent    Equipment Recommendations  Rolling walker with 5" wheels;3in1 (PT)(patient may not want BSC (3 in 1))    Recommendations for Other Services OT consult     Precautions / Restrictions Precautions Precautions: Knee Precaution Comments: no pillow under L knee; able to perfor SLR x 10 on L knee = no need  for immobilizer.  Restrictions Weight Bearing Restrictions: Yes LLE Weight Bearing: Weight bearing as tolerated Other Position/Activity Restrictions: no pillow under L knee; able to perfor SLR x 10 on L knee = no need for immobilizer.       Mobility  Bed Mobility Overal bed mobility: Needs Assistance Bed Mobility: Supine to Sit     Supine to sit: HOB elevated;Supervision;Min guard     General bed mobility comments: min guard for safety  Transfers Overall transfer level: Needs assistance Equipment used: Rolling walker (2 wheeled) Transfers: Sit to/from Stand Sit to Stand: Min guard         General transfer comment: patient transfered STS from slightly elevated edge of bed to chair with arms with CGA and cuing for hand placement, body and AD positioning for safe and smooth transfers.   Ambulation/Gait Ambulation/Gait assistance: Supervision;Min guard Gait Distance (Feet): 140 Feet Assistive device: Rolling walker (2 wheeled) Gait Pattern/deviations: Decreased stance time - left;Decreased step length - right Gait velocity: decreased   General Gait Details: patient ambulated 140 feet with RW and CGA- supervision, with mildy stooped posture putting pressure through RW. No feeling or evidence of buckling. decreased heel strike and knee flexion LLE.   Stairs            Wheelchair Mobility    Modified Rankin (Stroke Patients Only)       Balance Overall balance assessment: Needs assistance Sitting-balance support: Feet unsupported;Bilateral upper extremity supported Sitting balance-Leahy Scale: Good Sitting balance - Comments: able to sit at EOB without feet touching with close supervision.  stable with feet touching.    Standing balance support: Bilateral upper extremity supported;During functional activity Standing balance-Leahy Scale: Good Standing balance comment: Requires BUE support on RW for balance                             Pertinent  Vitals/Pain Pain Assessment: 0-10 Pain Score: 5  Pain Location: Left knee. able to tolerate well at this point.  Pain Descriptors / Indicators: Aching Pain Intervention(s): Limited activity within patient's tolerance;Monitored during session;RN gave pain meds during session;Ice applied    Home Living Family/patient expects to be discharged to:: Private residence Living Arrangements: Alone Available Help at Discharge: Family;Friend(s);Available PRN/intermittently(patient's daughter will be staying at night, others in intermittantly during day) Type of Home: House Home Access: Ramped entrance;Stairs to enter(does not use stairs) Entrance Stairs-Rails: None(plan to call son tomorrow to put a rail at ramp) Technical brewer of Steps: 6 Home Layout: Able to live on main level with bedroom/bathroom(basement that is not used) Home Equipment: Wheelchair - manual;Cane - single point;Grab bars - tub/shower;Shower seat - built in;Grab bars - toilet(under toilet seat bars) Additional Comments: pt feels she will not need a BSC; She reports her bed is 29" tall and she uses a 9" stool to get up on it. Would like to learn how to do this prior to discharge from hospital    Prior Function Level of Independence: Independent         Comments: patient reports she was I with all aspects of mobility and care, although she had to stop working due to worsening knee pain. Last worked 5 hours in Jan. 2019. Loves her job working part time at [employer] and is eager to return when able.      Hand Dominance        Extremity/Trunk Assessment   Upper Extremity Assessment Upper Extremity Assessment: Overall WFL for tasks assessed    Lower Extremity Assessment Lower Extremity Assessment: LLE deficits/detail;Generalized weakness(RLE WFL for tasks assessed) LLE Deficits / Details: able to perform SLR x 10 without support;  LLE: Unable to fully assess due to immobilization LLE Sensation: decreased light  touch(mild paresthesia)    Cervical / Trunk Assessment Cervical / Trunk Assessment: Normal  Communication   Communication: No difficulties  Cognition Arousal/Alertness: Awake/alert Behavior During Therapy: WFL for tasks assessed/performed Overall Cognitive Status: Within Functional Limits for tasks assessed                                        General Comments      Exercises Total Joint Exercises Straight Leg Raises: Supine;Right;Left;AROM;10 reps Long Arc Quad: Seated;Left;5 reps Goniometric ROM: 0-5-70 AAROM (lacking 5 degrees extension; has 70 degrees flexion, limited by pain and bandage Other Exercises Other Exercises: reveiwed heel pumps, LAQ, SLR for HEP. Discussed proper positioning of body and AD for transfers and ambulation.    Assessment/Plan    PT Assessment Patient needs continued PT services  PT Problem List Decreased strength;Decreased mobility;Decreased range of motion;Decreased activity tolerance;Decreased knowledge of precautions;Decreased skin integrity;Decreased balance;Decreased knowledge of use of DME;Pain;Impaired sensation       PT Treatment Interventions DME instruction;Functional mobility training;Balance training;Patient/family education;Gait training;Therapeutic activities;Neuromuscular re-education;Stair training;Therapeutic exercise;Manual techniques    PT Goals (Current goals can be found in the Care Plan section)  Acute Rehab PT Goals Patient Stated Goal: be able to get  in and out of 29" bed using 9" stool; return to PLOF, go home PT Goal Formulation: With patient Time For Goal Achievement: 07/17/18 Potential to Achieve Goals: Good    Frequency BID   Barriers to discharge Inaccessible home environment bed 29" high and must use 9" step to get in bed; no handrails at ramp    Co-evaluation               AM-PAC PT "6 Clicks" Mobility  Outcome Measure Help needed turning from your back to your side while in a flat bed  without using bedrails?: A Little Help needed moving from lying on your back to sitting on the side of a flat bed without using bedrails?: A Little Help needed moving to and from a bed to a chair (including a wheelchair)?: A Little Help needed standing up from a chair using your arms (e.g., wheelchair or bedside chair)?: A Little Help needed to walk in hospital room?: A Little Help needed climbing 3-5 steps with a railing? : Total 6 Click Score: 16    End of Session Equipment Utilized During Treatment: Gait belt Activity Tolerance: Patient tolerated treatment well;Patient limited by pain;Patient limited by fatigue Patient left: in chair;with call bell/phone within reach;with chair alarm set;with SCD's reapplied Nurse Communication: Mobility status;Weight bearing status;Precautions(results of session) PT Visit Diagnosis: Unsteadiness on feet (R26.81);Muscle weakness (generalized) (M62.81);Difficulty in walking, not elsewhere classified (R26.2);Pain Pain - Right/Left: Left Pain - part of body: Knee    Time: 9892-1194 PT Time Calculation (min) (ACUTE ONLY): 42 min   Charges:   PT Evaluation $PT Eval Low Complexity: 1 Low PT Treatments $Gait Training: 8-22 mins $Therapeutic Activity: 8-22 mins        Everlean Alstrom. Graylon Good, PT, DPT 07/03/18, 6:54 PM

## 2018-07-03 NOTE — Anesthesia Procedure Notes (Signed)
Spinal  Patient location during procedure: OR Start time: 07/03/2018 11:10 AM End time: 07/03/2018 11:20 AM Staffing Anesthesiologist: Emmie Niemann, MD Resident/CRNA: Jonna Clark, CRNA Performed: resident/CRNA and anesthesiologist  Preanesthetic Checklist Completed: patient identified, site marked, surgical consent, pre-op evaluation, timeout performed, IV checked, risks and benefits discussed and monitors and equipment checked Spinal Block Patient position: sitting Prep: Betadine Patient monitoring: heart rate, continuous pulse ox and blood pressure Approach: midline Location: L4-5 Injection technique: single-shot Needle Needle type: Introducer and Pencil-Tip  Needle gauge: 24 G Needle length: 9 cm Additional Notes Negative paresthesia. Negative blood return. Positive free-flowing CSF. Expiration date of kit checked and confirmed. Patient tolerated procedure well, without complications.

## 2018-07-03 NOTE — Transfer of Care (Signed)
Immediate Anesthesia Transfer of Care Note  Patient: Mackenzie White  Procedure(s) Performed: COMPUTER ASSISTED TOTAL KNEE ARTHROPLASTY - LEFT (Left )  Patient Location: PACU  Anesthesia Type:Spinal  Level of Consciousness: drowsy and patient cooperative  Airway & Oxygen Therapy: Patient Spontanous Breathing and Patient connected to nasal cannula oxygen  Post-op Assessment: Report given to RN and Post -op Vital signs reviewed and stable  Post vital signs: Reviewed and stable  Last Vitals:  Vitals Value Taken Time  BP 178/67 07/03/2018  2:56 PM  Temp    Pulse 80 07/03/2018  2:56 PM  Resp 13 07/03/2018  2:56 PM  SpO2 99 % 07/03/2018  2:56 PM  Vitals shown include unvalidated device data.  Last Pain:  Vitals:   07/03/18 0952  TempSrc: Temporal  PainSc: 2          Complications: No apparent anesthesia complications

## 2018-07-03 NOTE — Discharge Instructions (Signed)
°  Instructions after Total Knee Replacement ° ° [place] P. Daijon Wenke, Jr., M.D.    ° Dept. of Orthopaedics & Sports Medicine ° [facility] ° [address] ° [place], [place]  [address] ° Phone: [phone]   Fax: [phone] ° °  °DIET: °• Drink plenty of non-alcoholic fluids. °• Resume your normal diet. Include foods high in fiber. ° °ACTIVITY:  °• You may use crutches or a walker with weight-bearing as tolerated, unless instructed otherwise. °• You may be weaned off of the walker or crutches by your Physical Therapist.  °• Do NOT place pillows under the knee. Anything placed under the knee could limit your ability to straighten the knee.   °• Continue doing gentle exercises. Exercising will reduce the pain and swelling, increase motion, and prevent muscle weakness.   °• Please continue to use the TED compression stockings for 6 weeks. You may remove the stockings at night, but should reapply them in the morning. °• Do not drive or operate any equipment until instructed. ° °WOUND CARE:  °• Continue to use the PolarCare or ice packs periodically to reduce pain and swelling. °• You may bathe or shower after the staples are removed at the first office visit following surgery. ° °MEDICATIONS: °• You may resume your regular medications. °• Please take the pain medication as prescribed on the medication. °• Do not take pain medication on an empty stomach. °• You have been given a prescription for a blood thinner (Lovenox or Coumadin). Please take the medication as instructed. (NOTE: After completing a 2 week course of Lovenox, take one Enteric-coated aspirin once a day. This along with elevation will help reduce the possibility of phlebitis in your operated leg.) °• Do not drive or drink alcoholic beverages when taking pain medications. ° °CALL THE OFFICE FOR: °• Temperature above 101 degrees °• Excessive bleeding or drainage on the dressing. °• Excessive swelling, coldness, or paleness of the toes. °• Persistent  nausea and vomiting. ° °FOLLOW-UP:  °• You should have an appointment to return to the office in 10-14 days after surgery. °• Arrangements have been made for continuation of Physical Therapy (either home therapy or outpatient therapy). °  °

## 2018-07-03 NOTE — Anesthesia Post-op Follow-up Note (Signed)
Anesthesia QCDR form completed.        

## 2018-07-03 NOTE — H&P (Signed)
The patient has been re-examined, and the chart reviewed, and there have been no interval changes to the documented history and physical.    The risks, benefits, and alternatives have been discussed at length. The patient expressed understanding of the risks benefits and agreed with plans for surgical intervention.  James P. Hooten, Jr. M.D.    

## 2018-07-03 NOTE — Op Note (Signed)
OPERATIVE NOTE  DATE OF SURGERY:  07/03/2018  PATIENT NAME:  Mackenzie White   DOB: 1940-06-12  MRN: [id number]  PRE-OPERATIVE DIAGNOSIS: Degenerative arthrosis of the left knee, primary  POST-OPERATIVE DIAGNOSIS:  Same  PROCEDURE:  Left total knee arthroplasty using computer-assisted navigation  SURGEON:  Marciano Sequin. M.D.  ASSISTANT:  Blanca Friend, RN (present and scrubbed throughout the case, critical for assistance with exposure, retraction, instrumentation, and closure)  ANESTHESIA: spinal  ESTIMATED BLOOD LOSS: 50 mL  FLUIDS REPLACED: 1300 mL of crystalloid  TOURNIQUET TIME: 95 minutes  DRAINS: 2 medium Hemovac drains  SOFT TISSUE RELEASES: Anterior cruciate ligament, posterior cruciate ligament, deep medial collateral ligament, patellofemoral ligament  IMPLANTS UTILIZED: DePuy Attune size 5N posterior stabilized femoral component (cemented), size 4 rotating platform tibial component (cemented), 38 mm medialized dome patella (cemented), and a 5 mm stabilized rotating platform polyethylene insert.  INDICATIONS FOR SURGERY: Mackenzie White is a 78 y.o. year old female with a long history of progressive knee pain. X-rays demonstrated severe degenerative changes in tricompartmental fashion. The patient had not seen any significant improvement despite conservative nonsurgical intervention. After discussion of the risks and benefits of surgical intervention, the patient expressed understanding of the risks benefits and agree with plans for total knee arthroplasty.   The risks, benefits, and alternatives were discussed at length including but not limited to the risks of infection, bleeding, nerve injury, stiffness, blood clots, the need for revision surgery, cardiopulmonary complications, among others, and they were willing to proceed.  PROCEDURE IN DETAIL: The patient was brought into the operating room and, after adequate spinal anesthesia was achieved, a tourniquet was  placed on the patient's upper thigh. The patient's knee and leg were cleaned and prepped with alcohol and DuraPrep and draped in the usual sterile fashion. A "timeout" was performed as per usual protocol. The lower extremity was exsanguinated using an Esmarch, and the tourniquet was inflated to 300 mmHg. An anterior longitudinal incision was made followed by a standard mid vastus approach. The deep fibers of the medial collateral ligament were elevated in a subperiosteal fashion off of the medial flare of the tibia so as to maintain a continuous soft tissue sleeve. The patella was subluxed laterally and the patellofemoral ligament was incised. Inspection of the knee demonstrated severe degenerative changes with full-thickness loss of articular cartilage. Osteophytes were debrided using a rongeur. Anterior and posterior cruciate ligaments were excised. Two 4.0 mm Schanz pins were inserted in the femur and into the tibia for attachment of the array of trackers used for computer-assisted navigation. Hip center was identified using a circumduction technique. Distal landmarks were mapped using the computer. The distal femur and proximal tibia were mapped using the computer. The distal femoral cutting guide was positioned using computer-assisted navigation so as to achieve a 5 distal valgus cut. The femur was sized and it was felt that a size 5N femoral component was appropriate. A size 5 femoral cutting guide was positioned and the anterior cut was performed and verified using the computer. This was followed by completion of the posterior and chamfer cuts. Femoral cutting guide for the central box was then positioned in the center box cut was performed.  Attention was then directed to the proximal tibia. Medial and lateral menisci were excised. The extramedullary tibial cutting guide was positioned using computer-assisted navigation so as to achieve a 0 varus-valgus alignment and 3 posterior slope. The cut was  performed and verified using the computer. The proximal tibia  was sized and it was felt that a size 4 tibial tray was appropriate. Tibial and femoral trials were inserted followed by insertion of a 5 mm polyethylene insert. This allowed for excellent mediolateral soft tissue balancing both in flexion and in full extension. Finally, the patella was cut and prepared so as to accommodate a 38 mm medialized dome patella. A patella trial was placed and the knee was placed through a range of motion with excellent patellar tracking appreciated. The femoral trial was removed after debridement of posterior osteophytes. The central post-hole for the tibial component was reamed followed by insertion of a keel punch. Tibial trials were then removed. Cut surfaces of bone were irrigated with copious amounts of normal saline with antibiotic solution using pulsatile lavage and then suctioned dry. Polymethylmethacrylate cement was prepared in the usual fashion using a vacuum mixer. Cement was applied to the cut surface of the proximal tibia as well as along the undersurface of a size 4 rotating platform tibial component. Tibial component was positioned and impacted into place. Excess cement was removed using [employer] fast streamer. Cement was then applied to the cut surfaces of the femur as well as along the posterior flanges of the size 5N femoral component. The femoral component was positioned and impacted into place. Excess cement was removed using [employer] fast streamer. A 5 mm polyethylene trial was inserted and the knee was brought into full extension with steady axial compression applied. Finally, cement was applied to the backside of a 38 mm medialized dome patella and the patellar component was positioned and patellar clamp applied. Excess cement was removed using [employer] fast streamer. After adequate curing of the cement, the tourniquet was deflated after a total tourniquet time of 95 minutes. Hemostasis was achieved using electrocautery. The  knee was irrigated with copious amounts of normal saline with antibiotic solution using pulsatile lavage and then suctioned dry. 20 mL of 1.3% Exparel and 60 mL of 0.25% Marcaine in 40 mL of normal saline was injected along the posterior capsule, medial and lateral gutters, and along the arthrotomy site. A 5 mm stabilized rotating platform polyethylene insert was inserted and the knee was placed through a range of motion with excellent mediolateral soft tissue balancing appreciated and excellent patellar tracking noted. 2 medium drains were placed in the wound bed and brought out through separate stab incisions. The medial parapatellar portion of the incision was reapproximated using interrupted sutures of #1 Vicryl. Subcutaneous tissue was approximated in layers using first #0 Vicryl followed #2-0 Vicryl. The skin was approximated with skin staples. A sterile dressing was applied.  The patient tolerated the procedure well and was transported to the recovery room in stable condition.    Jacquline Terrill P. Holley Bouche., M.D.

## 2018-07-04 ENCOUNTER — Encounter: Payer: Self-pay | Admitting: Orthopedic Surgery

## 2018-07-04 MED ORDER — OXYCODONE HCL 5 MG PO TABS: 5.0000 mg | ORAL_TABLET | ORAL | 0 refills | Status: DC | PRN

## 2018-07-04 MED ORDER — ENOXAPARIN SODIUM 40 MG/0.4ML ~~LOC~~ SOLN: 40.0000 mg | SUBCUTANEOUS | 0 refills | Status: DC

## 2018-07-04 MED ORDER — TRAMADOL HCL 50 MG PO TABS: 50.0000 mg | ORAL_TABLET | Freq: Four times a day (QID) | ORAL | 0 refills | Status: DC | PRN

## 2018-07-04 NOTE — Progress Notes (Signed)
Physical Therapy Treatment Patient Details Name: Mackenzie White MRN: [id number] DOB: 1940/05/23 Today's Date: 07/04/2018    History of Present Illness Mackenzie White is a 78 y.o. female admitted to the hospital s/p L TKA performed 5/91/6384 with no complications. Relevant PMI includes bilateral knee OA (pt states R needs TKA as well but currently doing well), GERD, HTN, depression.     PT Comments    Patient is pleasant and eager to participate in PT session upon PT arrival. Patient given TKR education packet and performed interventions of HEP to demonstrate understanding. ROM improved with repetition of L knee movements, with increased ROM noted in sitting due to effects of gravity, muscle relaxation, and multiple exercises with knee extension measuring 91 degrees flexion, -2 degrees extension. Patient is very motivated and agreeable to ambulation after exercises with RW adjusted for proper height. Current POC remains appropriate at this time.    Follow Up Recommendations  Home health PT;Supervision - Intermittent     Equipment Recommendations  Rolling walker with 5" wheels;3in1 (PT)(patient may not want BSC (3 in 1))    Recommendations for Other Services OT consult     Precautions / Restrictions Precautions Precautions: Knee Precaution Comments: no pillow under L knee; able to perfor SLR x 10 on L knee = no need for immobilizer.  Restrictions Weight Bearing Restrictions: Yes LLE Weight Bearing: Weight bearing as tolerated Other Position/Activity Restrictions: no pillow under L knee; able to perfor SLR x 10 on L knee = no need for immobilizer.     Mobility  Bed Mobility Overal bed mobility: Needs Assistance Bed Mobility: Supine to Sit     Supine to sit: HOB elevated;Supervision;Min guard     General bed mobility comments: min guard for safety, education for using RLE to guide LLE off/on bed  Transfers Overall transfer level: Needs assistance Equipment used: Rolling  walker (2 wheeled) Transfers: Sit to/from Stand Sit to Stand: Min guard         General transfer comment: patient transfered STS from slightly elevated edge of bed to chair with arms with CGA and cuing for hand placement, body and AD positioning for safe and smooth transfers.   Ambulation/Gait Ambulation/Gait assistance: Supervision;Min guard Gait Distance (Feet): 120 Feet Assistive device: Rolling walker (2 wheeled) Gait Pattern/deviations: Decreased stance time - left;Decreased step length - right Gait velocity: decreased   General Gait Details: patient ambulated 140 feet with RW and CGA- supervision, with mildy stooped posture putting pressure through RW. No feeling or evidence of buckling. decreased heel strike and knee flexion LLE.    Stairs             Wheelchair Mobility    Modified Rankin (Stroke Patients Only)       Balance Overall balance assessment: Needs assistance Sitting-balance support: Feet unsupported;Bilateral upper extremity supported Sitting balance-Leahy Scale: Good Sitting balance - Comments: able to sit at EOB without feet touching with close supervision. stable with feet touching.    Standing balance support: Bilateral upper extremity supported;During functional activity Standing balance-Leahy Scale: Good Standing balance comment: Requires BUE support on RW for balance with ambulation, able to stand without UE support for static stability                             Cognition Arousal/Alertness: Awake/alert Behavior During Therapy: WFL for tasks assessed/performed Overall Cognitive Status: Within Functional Limits for tasks assessed  General Comments: eager to participate with PT      Exercises Total Joint Exercises Ankle Circles/Pumps: Strengthening;Both;10 reps;Supine Quad Sets: Strengthening;Left;10 reps;Supine(3 second holds) Short Arc Quad: Strengthening;Left;10  reps;Supine Heel Slides: AAROM;Strengthening;Left;10 reps;Supine Hip ABduction/ADduction: Strengthening;Left;Right;Both;10 reps;Supine Straight Leg Raises: Supine;Right;Left;AROM;10 reps Long Arc Quad: Strengthening;Left;10 reps;Seated Knee Flexion: AAROM;AROM;Left;10 reps;Seated Goniometric ROM: bandage limiting full motion: seated patient has 91 degrees flexion, supine challenged by bandages. extension -2  degrees Other Exercises Other Exercises: reviewed TKR exercise packet Other Exercises: ambulate 120 ft with RW , walker adjusted for height    General Comments General comments (skin integrity, edema, etc.): RLE wrapped      Pertinent Vitals/Pain Pain Assessment: 0-10 Pain Score: 3  Pain Location: Left knee. able to tolerate well at this point.  Pain Descriptors / Indicators: Aching Pain Intervention(s): Monitored during session;Repositioned;Ice applied    Home Living                      Prior Function            PT Goals (current goals can now be found in the care plan section) Acute Rehab PT Goals Patient Stated Goal: be able to get in and out of 29" bed using 9" stool; return to PLOF, go home PT Goal Formulation: With patient Time For Goal Achievement: 07/17/18 Potential to Achieve Goals: Good Additional Goals Additional Goal #1: Patient will demonstrate ability to get on/off surface at least 29" from floor using 9" step and min A in order to demonstrate ability to get in and out of bed safely at home. Progress towards PT goals: Progressing toward goals    Frequency    BID      PT Plan Current plan remains appropriate    Co-evaluation              AM-PAC PT "6 Clicks" Mobility   Outcome Measure  Help needed turning from your back to your side while in a flat bed without using bedrails?: A Little Help needed moving from lying on your back to sitting on the side of a flat bed without using bedrails?: A Little Help needed moving to and from a  bed to a chair (including a wheelchair)?: A Little Help needed standing up from a chair using your arms (e.g., wheelchair or bedside chair)?: A Little Help needed to walk in hospital room?: A Little Help needed climbing 3-5 steps with a railing? : Total 6 Click Score: 16    End of Session Equipment Utilized During Treatment: Gait belt Activity Tolerance: Patient tolerated treatment well;Patient limited by fatigue Patient left: with call bell/phone within reach;with SCD's reapplied;in bed;with bed alarm set Nurse Communication: Mobility status;Weight bearing status PT Visit Diagnosis: Unsteadiness on feet (R26.81);Muscle weakness (generalized) (M62.81);Difficulty in walking, not elsewhere classified (R26.2);Pain Pain - Right/Left: Left Pain - part of body: Knee     Time: 1610-9604 PT Time Calculation (min) (ACUTE ONLY): 39 min  Charges:  $Gait Training: 8-22 mins $Therapeutic Exercise: 23-37 mins                     Janna Arch, PT, DPT     Janna Arch 07/04/2018, 10:12 AM

## 2018-07-04 NOTE — Progress Notes (Signed)
Physical Therapy Treatment Patient Details Name: Mackenzie White MRN: [id number] DOB: 09-Jul-1940 Today's Date: 07/04/2018    History of Present Illness Mackenzie White is a 78 y.o. female admitted to the hospital s/p L TKA performed 2/54/2706 with no complications. Relevant PMI includes bilateral knee OA (pt states R needs TKA as well but currently doing well), GERD, HTN, depression.     PT Comments    Patient eager to participate in physical therapy and agreeable upon PT entering room. Patient educated on safe negotiation of one step for carryover to home environment. Patient demonstrated understanding after use of demonstration, verbal, and tactile cues introduced for task sequencing and performance with RW and CGA.  Patient performed toileting with Mod I and CGA for transfers due to limited weight acceptance onto LLE with transfers. Patient washed hands standing without UE support and without LOB. Ambulation technique and sequencing with RW is improving with decreased need for verbal cueing. Current POC remains appropriate at this time.   Follow Up Recommendations  Home health PT;Supervision - Intermittent     Equipment Recommendations  Rolling walker with 5" wheels;3in1 (PT)(patient may not want BSC (3 in 1))    Recommendations for Other Services OT consult     Precautions / Restrictions Precautions Precautions: Knee Precaution Comments: no pillow under L knee; able to perfor SLR x 10 on L knee = no need for immobilizer.  Restrictions Weight Bearing Restrictions: Yes LLE Weight Bearing: Weight bearing as tolerated    Mobility  Bed Mobility Overal bed mobility: Needs Assistance Bed Mobility: Supine to Sit;Sit to Supine     Supine to sit: Supervision;HOB elevated Sit to supine: Supervision   General bed mobility comments: min guard for safety, demonstrated learning from this a.m. session using RLE to guide LLE off/on bed  Transfers Overall transfer level: Needs  assistance Equipment used: Rolling walker (2 wheeled) Transfers: Sit to/from Stand Sit to Stand: Min guard         General transfer comment: CGA with hand placement, STS with noted weight shift onto RLE and UE reliance   Ambulation/Gait Ambulation/Gait assistance: Supervision;Min guard Gait Distance (Feet): 180 Feet Assistive device: Rolling walker (2 wheeled) Gait Pattern/deviations: Decreased stance time - left;Decreased step length - right Gait velocity: decreased   General Gait Details: verbal cueing required for widening BOS due to narrowing with fatigue, improved gait velocity and weight shift from a.m. session.    Stairs             Wheelchair Mobility    Modified Rankin (Stroke Patients Only)       Balance Overall balance assessment: Needs assistance Sitting-balance support: No upper extremity supported;Feet supported Sitting balance-Leahy Scale: Good Sitting balance - Comments: able to sit at EOB without feet touching with close supervision. stable with feet touching.    Standing balance support: No upper extremity supported;During functional activity Standing balance-Leahy Scale: Good Standing balance comment: stood at sink to wash hands. Needs RW for ambulation                             Cognition Arousal/Alertness: Awake/alert Behavior During Therapy: WFL for tasks assessed/performed Overall Cognitive Status: Within Functional Limits for tasks assessed                                 General Comments: eager to participate with PT  Exercises Other Exercises Other Exercises: reviewed TKR exercise packet again, patient demosntrates/verbalizes understanding Other Exercises: step negotiation, patient ambulated to stairway, verbal cueing with demonstration for task sequencing performed. Use of RW with "up with the good, down with the bad" backwards step up/down to mimic step at home onto/off bed. Patient performed multiple  trails to demonstrate understanding.  Other Exercises: pt instructed in polar care mgt, compression stocking mgt, falls prevention, AE/DME, and home/routines modifications  Other Exercises: pt amb with RW and CGA to bathroom for toileting task, able to perform transfer w/ CGA, demonstrating good safety awareness and RW use, indep with hygiene Other Exercises: ambulate with RW and CGA with focus on widening BOS and increasing forward momentum for improved gait mechanics and stability.     General Comments General comments (skin integrity, edema, etc.): RLE wrapped      Pertinent Vitals/Pain Pain Assessment: 0-10 Pain Score: 2  Pain Location: Left knee. able to tolerate well at this point.  Pain Descriptors / Indicators: Aching Pain Intervention(s): Monitored during session;Repositioned;Ice applied    Home Living Family/patient expects to be discharged to:: Private residence Living Arrangements: Alone Available Help at Discharge: Family;Friend(s);Available PRN/intermittently(patient's daughter will be staying at night, others in intermittantly during day) Type of Home: House Home Access: Ramped entrance;Stairs to enter(does not use stairs) Entrance Stairs-Rails: None(plan to call son tomorrow to put a rail at ramp) Home Layout: Able to live on main level with bedroom/bathroom(basement that is not used) Home Equipment: Wheelchair - manual;Cane - single point;Grab bars - tub/shower;Shower seat - built in;Grab bars - toilet(under toilet seat bars) Additional Comments: pt feels she will not need a BSC (she has rails installed on her toilet); She reports her bed is 29" tall and she uses a 9" stool to get up on it. Would like to learn how to do this prior to discharge from hospital    Prior Function Level of Independence: Independent      Comments: patient reports she was I with all aspects of mobility and care, although she had to stop working due to worsening knee pain. Last worked 5 hours in  Jan. 2019. Loves her job working part time at [employer] and is eager to return when able.    PT Goals (current goals can now be found in the care plan section) Acute Rehab PT Goals Patient Stated Goal: be able to get in and out of 29" bed using 9" stool; return to PLOF, go home PT Goal Formulation: With patient Time For Goal Achievement: 07/17/18 Potential to Achieve Goals: Good Additional Goals Additional Goal #1: Patient will demonstrate ability to get on/off surface at least 29" from floor using 9" step and min A in order to demonstrate ability to get in and out of bed safely at home. Progress towards PT goals: Progressing toward goals    Frequency    BID      PT Plan Current plan remains appropriate    Co-evaluation              AM-PAC PT "6 Clicks" Mobility   Outcome Measure  Help needed turning from your back to your side while in a flat bed without using bedrails?: A Little Help needed moving from lying on your back to sitting on the side of a flat bed without using bedrails?: A Little Help needed moving to and from a bed to a chair (including a wheelchair)?: A Little Help needed standing up from a chair using your arms (e.g., wheelchair  or bedside chair)?: A Little Help needed to walk in hospital room?: A Little Help needed climbing 3-5 steps with a railing? : A Little 6 Click Score: 18    End of Session Equipment Utilized During Treatment: Gait belt Activity Tolerance: Patient tolerated treatment well;Patient limited by fatigue Patient left: with call bell/phone within reach;with SCD's reapplied;in bed;with bed alarm set Nurse Communication: Mobility status;Weight bearing status PT Visit Diagnosis: Unsteadiness on feet (R26.81);Muscle weakness (generalized) (M62.81);Difficulty in walking, not elsewhere classified (R26.2);Pain Pain - Right/Left: Left Pain - part of body: Knee     Time: 1336-1400 PT Time Calculation (min) (ACUTE ONLY): 24 min  Charges:   $Gait Training: 8-22 mins $Therapeutic Activity: 8-22 mins                     Janna Arch, PT, DPT     Janna Arch 07/04/2018, 2:32 PM

## 2018-07-04 NOTE — Discharge Summary (Signed)
Physician Discharge Summary  Patient ID: Mackenzie White MRN: [id number] DOB/AGE: 77-Apr-1942 77 y.o.  Admit date: 07/03/2018 Discharge date: 07/05/2018  Admission Diagnoses:  PRIMARY OSTEOARTHRITIS OF LEFT KNEE   Discharge Diagnoses: Patient Active Problem List   Diagnosis Date Noted  . Depression 07/03/2018  . GERD (gastroesophageal reflux disease) 07/03/2018  . Hypertension 07/03/2018  . 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 Primary osteoarthritis of both knees 04/23/2018    Past Medical History:  Diagnosis Date  . Allergy   . Arthritis   . Cancer ([place])    basal cell skin cancer  . Carpal tunnel syndrome of right wrist   . Cataract    bilateral eyes in 2015  . Depression   . Diverticulosis   . Gallstones   . GERD (gastroesophageal reflux disease)   . History of gluten intolerance   . History of hiatal hernia    big per pt  . Hyperlipemia   . Hypertension      Transfusion: No transfusions during this admission   Consultants (if any): None  Discharged Condition: Improved  Hospital Course: Mackenzie White is an 78 y.o. female who was admitted 07/03/2018 with a diagnosis of degenerative arthrosis left knee and went to the operating room on 07/03/2018 and underwent the above named procedures.    Surgeries:Procedure(s): COMPUTER ASSISTED TOTAL KNEE ARTHROPLASTY - LEFT on 07/03/2018  PRE-OPERATIVE DIAGNOSIS: Degenerative arthrosis of the left knee, primary  POST-OPERATIVE DIAGNOSIS:  Same  PROCEDURE:  Left total knee arthroplasty using computer-assisted navigation  SURGEON:  Marciano Sequin. M.D.  ASSISTANT:  Blanca Friend, RN (present and scrubbed throughout the case, critical for assistance with exposure, retraction, instrumentation, and closure)  ANESTHESIA: spinal  ESTIMATED BLOOD LOSS: 50 mL  FLUIDS REPLACED: 1300 mL of crystalloid  TOURNIQUET TIME: 95 minutes  DRAINS: 2 medium Hemovac drains  SOFT TISSUE RELEASES: Anterior  cruciate ligament, posterior cruciate ligament, deep medial collateral ligament, patellofemoral ligament  IMPLANTS UTILIZED: DePuy Attune size 5N posterior stabilized femoral component (cemented), size 4 rotating platform tibial component (cemented), 38 mm medialized dome patella (cemented), and a 5 mm stabilized rotating platform polyethylene insert.  INDICATIONS FOR SURGERY: Mackenzie White is a 78 y.o. year old female with a long history of progressive knee pain. X-rays demonstrated severe degenerative changes in tricompartmental fashion. The patient had not seen any significant improvement despite conservative nonsurgical intervention. After discussion of the risks and benefits of surgical intervention, the patient expressed understanding of the risks benefits and agree with plans for total knee arthroplasty.   The risks, benefits, and alternatives were discussed at length including but not limited to the risks of infection, bleeding, nerve injury, stiffness, blood clots, the need for revision surgery, cardiopulmonary complications, among others, and they were willing to proceed. Patient tolerated the surgery well. No complications .Patient was taken to PACU where she was stabilized and then transferred to the orthopedic floor.  Patient started on Lovenox 30 mg q 12 hrs. Foot pumps applied bilaterally at 80 mm hgb. Heels elevated off bed with rolled towels. No evidence of DVT. Calves non tender. Negative Homan. Physical therapy started on day #1 for gait training and transfer with OT starting on  day #1 for ADL and assisted devices. Patient has done well with therapy. Ambulated greater than 200 feet upon being discharged.  Was able to ascend descend 4 steps safely and independently  Patient's IV And Foley were discontinued on day #1 with Hemovac being discontinued on day #2.  Dressing was changed on day 2 prior to patient being discharged   She was given perioperative antibiotics:   Anti-infectives (From admission, onward)   Start     Dose/Rate Route Frequency Ordered Stop   07/03/18 1700  clindamycin (CLEOCIN) IVPB 600 mg     600 mg 100 mL/hr over 30 Minutes Intravenous Every 6 hours 07/03/18 1647 07/04/18 1659   07/03/18 0934  clindamycin (CLEOCIN) 900 MG/50ML IVPB    Note to Pharmacy:  Dewayne Hatch   : cabinet override      07/03/18 0934 07/03/18 1132   07/03/18 0600  clindamycin (CLEOCIN) IVPB 900 mg     900 mg 100 mL/hr over 30 Minutes Intravenous On call to O.R. 07/03/18 0117 07/03/18 1142    .  She was fitted with AV 1 compression foot pump devices, instructed on heel pumps, early ambulation, and fitted with TED stockings bilaterally for DVT prophylaxis.  She benefited maximally from the hospital stay and there were no complications.    Recent vital signs:  Vitals:   07/04/18 0021 07/04/18 0531  BP: (!) 93/51 (!) 92/51  Pulse: 64 64  Resp: 16 16  Temp: (!) 97.3 F (36.3 C) 97.6 F (36.4 C)  SpO2: 92% 93%    Recent laboratory studies:  Lab Results  Component Value Date   HGB 13.4 06/29/2018   Lab Results  Component Value Date   WBC 9.5 06/29/2018   PLT 313 06/29/2018   Lab Results  Component Value Date   INR 1.1 06/29/2018   Lab Results  Component Value Date   NA 139 06/29/2018   K 4.0 06/29/2018   CL 103 06/29/2018   CO2 25 06/29/2018   BUN 26 (H) 06/29/2018   CREATININE 0.95 06/29/2018   GLUCOSE 104 (H) 06/29/2018    Discharge Medications:   Allergies as of 07/04/2018      Reactions   Asa [aspirin] Hives   Gluten Meal Diarrhea, Nausea Only   Bloating, GI pain    Penicillins Swelling   Did it involve swelling of the face/tongue/throat, SOB, or low BP? Yes Did it involve sudden or severe rash/hives, skin peeling, or any reaction on the inside of your mouth or nose? No Did you need to seek medical attention at a hospital or doctor's office? Yes When did it last happen?50+ years If all above answers are "NO", may  proceed with cephalosporin use.   Sulfa Antibiotics Hives   Tetracyclines & Related Hives      Medication List    TAKE these medications   acetaminophen 325 MG tablet Commonly known as:  TYLENOL Take 650 mg by mouth at bedtime. And PRN   ALPRAZolam 0.25 MG tablet Commonly known as:  XANAX Take 0.25 mg by mouth daily as needed for anxiety.   CALCIUM 600 + D PO Take 1 tablet by mouth at bedtime.   diphenhydrAMINE 25 MG tablet Commonly known as:  BENADRYL Take 25 mg by mouth at bedtime.   DULoxetine 60 MG capsule Commonly known as:  CYMBALTA Take 60 mg by mouth every morning.   enoxaparin 40 MG/0.4ML injection Commonly known as:  LOVENOX Inject 0.4 mLs (40 mg total) into the skin daily for 14 days. Start taking on:  Jul 06, 2018   fluticasone 50 MCG/ACT nasal spray Commonly known as:  FLONASE Place 1 spray into both nostrils daily as needed for allergies or rhinitis.   GAS-X PO Take 1 tablet by mouth daily as needed (gas).  lisinopril-hydrochlorothiazide 20-12.5 MG tablet Commonly known as:  ZESTORETIC Take 1 tablet by mouth at bedtime.   meloxicam 7.5 MG tablet Commonly known as:  MOBIC Take 1 tablet by mouth 2 (two) times daily.   NexIUM 40 MG capsule Generic drug:  esomeprazole Take 1 capsule (40 mg total) by mouth daily at 12 noon. What changed:  when to take this   oxyCODONE 5 MG immediate release tablet Commonly known as:  Oxy IR/ROXICODONE Take 1 tablet (5 mg total) by mouth every 4 (four) hours as needed for moderate pain (pain score 4-6).   Probiotic Caps Take 1 capsule by mouth daily.   simvastatin 80 MG tablet Commonly known as:  ZOCOR Take 80 mg by mouth at bedtime.   traMADol 50 MG tablet Commonly known as:  ULTRAM Take 1-2 tablets (50-100 mg total) by mouth every 6 (six) hours as needed for moderate pain.            Durable Medical Equipment  (From admission, onward)         Start     Ordered   07/03/18 1648  DME Walker rolling   Once    Question:  Patient needs a walker to treat with the following condition  Answer:  Total knee replacement status   07/03/18 1647   07/03/18 1648  DME Bedside commode  Once    Question:  Patient needs a bedside commode to treat with the following condition  Answer:  Total knee replacement status   07/03/18 1647          Diagnostic Studies: Dg Knee Left Port  Result Date: 07/03/2018 CLINICAL DATA:  Status post left knee replacement EXAM: PORTABLE LEFT KNEE - 1-2 VIEW COMPARISON:  None FINDINGS: Left knee prosthesis is noted. Two surgical drains are seen in place. No acute bony or soft tissue abnormality is noted. IMPRESSION: Status post left knee replacement. Electronically Signed   By: Inez Catalina M.D.   On: 07/03/2018 15:12    Disposition:   Discharge Instructions    Diet - low sodium heart healthy   Complete by:  As directed    Increase activity slowly   Complete by:  As directed       Follow-up Information    Watt Climes, PA On 07/17/2018.   Specialty:  Physician Assistant Why:  at 1:15pm Contact information: [place] [address] [phone]            Signed: Watt Climes 07/04/2018, 7:46 AM

## 2018-07-04 NOTE — Progress Notes (Signed)
ORTHOPAEDICS PROGRESS NOTE  PATIENT NAME: Mackenzie White DOB: Mar 23, 1940  MRN: [id number]  POD # 1: Left total knee arthroplasty  Subjective: The patient rested well last night. Pain is been under good control.  No nausea or vomiting. The patient did extremely well with physical therapy yesterday.  Objective: Vital signs in last 24 hours: Temp:  [97.3 F (36.3 C)-98.6 F (37 C)] 97.6 F (36.4 C) (05/19 0531) Pulse Rate:  [64-84] 64 (05/19 0531) Resp:  [12-20] 16 (05/19 0531) BP: (92-178)/(51-160) 92/51 (05/19 0531) SpO2:  [92 %-98 %] 93 % (05/19 0531) Weight:  [79.5 kg] 79.5 kg (05/18 1839)  Intake/Output from previous day: 05/18 0701 - 05/19 0700 In: 2488.9 [I.V.:2038.5; IV Piggyback:450.3] Out: 1070 [Urine:775; Drains:245; Blood:50]  No results for input(s): WBC, HGB, HCT, PLT, K, CL, CO2, BUN, CREATININE, GLUCOSE, CALCIUM, LABPT, INR in the last 72 hours.  EXAM General: Well-developed well-nourished female seen in no apparent discomfort. Lungs: clear to auscultation Cardiac: normal rate and regular rhythm Abdomen: Soft, nontender, nondistended.  Active bowel sounds are present. Left lower extremity: Dressing is dry and intact.  The VAC drain and Polar Care are in place and functioning.  The patient is able to perform an independent straight leg raise.  Homans test is negative. Neurologic: Awake, alert, and oriented.  Sensory and motor function are intact.  Assessment: Left total knee arthroplasty  Secondary diagnoses: Hypertension Hyperlipidemia Gluten intolerance Gastroesophageal reflux disease Depression  Plan: Today's goals were reviewed with the patient.  Continue with physical therapy and Occupational Therapy as per total knee arthroplasty rehab protocol. Plan is to go Home after hospital stay. DVT Prophylaxis - Lovenox, Foot Pumps and TED hose  James P. Holley Bouche M.D.

## 2018-07-04 NOTE — TOC Benefit Eligibility Note (Signed)
Transition of Care [facility]) Benefit Eligibility Note    Patient Details  Name: SHAREEKA YIM MRN: [id number] Date of Birth: Sep 27, 1940   Medication/Dose: Lovenox 94m once daily for 14 days  Covered?: Yes  Tier: 3 Drug  Prescription Coverage Preferred Pharmacy: Any retail pharmacy  Spoke with Person/Company/Phone Number:: Tyra with [employer] - [phone] Co-Pay: $50 maximum estimated copay for Tier 3 medicaiton  Prior Approval: No  Deductible: Met  Additional Notes: Generic Enoxparin covered with no PA required.  Considered Tier 1 with $10 estimated copay.      HDannette BarbaraPhone Number: [phone] [phone]/19/2020, 8:53 AM

## 2018-07-04 NOTE — Evaluation (Signed)
Occupational Therapy Evaluation Patient Details Name: Mackenzie White MRN: [id number] DOB: 09-16-1940 Today's Date: 07/04/2018    History of Present Illness [name] is a 78 y.o. female admitted to the hospital s/p L TKA performed 8/33/8250 with no complications. Relevant PMI includes bilateral knee OA (pt states R needs TKA as well but currently doing well), GERD, HTN, depression.    Clinical Impression   Pt seen for OT evaluation this date, POD#1 from above surgery. Pt was independent in all ADL prior to surgery, however limited by L knee pain. Pt is eager to return to PLOF with less pain and improved safety and independence. Pt currently requires minimal assist for LB dressing and bathing while in seated position due to pain and limited AROM of L knee (especially due to bulky post-surgical dressing). Pt instructed in polar care mgt, falls prevention strategies, home/routines modifications, DME/AE for LB bathing and dressing tasks, and compression stocking mgt. Pt verbalized understanding and would benefit from additional skilled OT services while in the hospital including additional instruction in dressing techniques with or without assistive devices and instruction in bed mobility to allow her to more safely negotiate her tall bed at home prior to discharge and ultimately to maximize her safety, independence, and minimize falls risk and caregiver burden. Do not currently anticipate any OT needs following this hospitalization.     Follow Up Recommendations  No OT follow up    Equipment Recommendations  Other (comment)(reacher)    Recommendations for Other Services       Precautions / Restrictions Precautions Precautions: Knee Precaution Comments: no pillow under L knee; able to perfor SLR x 10 on L knee = no need for immobilizer.  Restrictions Weight Bearing Restrictions: Yes LLE Weight Bearing: Weight bearing as tolerated Other Position/Activity Restrictions: no pillow  under L knee; able to perfor SLR x 10 on L knee = no need for immobilizer.       Mobility Bed Mobility Overal bed mobility: Needs Assistance Bed Mobility: Supine to Sit;Sit to Supine     Supine to sit: Supervision;HOB elevated Sit to supine: Supervision   General bed mobility comments: min guard for safety, education for using RLE to guide LLE off/on bed  Transfers Overall transfer level: Needs assistance Equipment used: Rolling walker (2 wheeled) Transfers: Sit to/from Stand Sit to Stand: Min guard         General transfer comment: patient transfered STS from slightly elevated edge of bed to chair with arms with CGA and cuing for hand placement, body and AD positioning for safe and smooth transfers.     Balance Overall balance assessment: Needs assistance Sitting-balance support: No upper extremity supported;Feet supported Sitting balance-Leahy Scale: Good Sitting balance - Comments: able to sit at EOB without feet touching with close supervision. stable with feet touching.    Standing balance support: No upper extremity supported;During functional activity Standing balance-Leahy Scale: Good Standing balance comment: stood at sink for grooming tasks reaching within base of support                           ADL either performed or assessed with clinical judgement   ADL Overall ADL's : Needs assistance/impaired Eating/Feeding: Sitting;Independent   Grooming: Wash/dry hands;Wash/dry face;Oral care;Standing;Supervision/safety   Upper Body Bathing: Sitting;Set up;Supervision/ safety   Lower Body Bathing: Sit to/from stand;Minimal assistance   Upper Body Dressing : Sitting;Set up;Supervision/safety   Lower Body Dressing: Sit to/from stand;Minimal assistance  Toilet Transfer: Comfort height toilet;RW;Ambulation;Min Psychiatric nurse Details (indicate cue type and reason): amb to bathroom, BSC frame over toilet Toileting- Clothing Manipulation and  Hygiene: Sitting/lateral lean;Independent               Vision Baseline Vision/History: Wears glasses Wears Glasses: Reading only Patient Visual Report: No change from baseline       Perception     Praxis      Pertinent Vitals/Pain Pain Assessment: No/denies pain Pain Score: 3  Pain Location: Left knee. able to tolerate well at this point.  Pain Descriptors / Indicators: Aching Pain Intervention(s): Monitored during session;Limited activity within patient's tolerance;Repositioned;Ice applied     Hand Dominance Right   Extremity/Trunk Assessment Upper Extremity Assessment Upper Extremity Assessment: Overall WFL for tasks assessed   Lower Extremity Assessment Lower Extremity Assessment: LLE deficits/detail(RLE WFL) LLE Deficits / Details: expected post-op strength/ROM deficits but functionally doing well   Cervical / Trunk Assessment Cervical / Trunk Assessment: Normal   Communication Communication Communication: No difficulties   Cognition Arousal/Alertness: Awake/alert Behavior During Therapy: WFL for tasks assessed/performed Overall Cognitive Status: Within Functional Limits for tasks assessed                                    General Comments  RLE wrapped    Exercises Other Exercises Other Exercises: pt instructed in polar care mgt, compression stocking mgt, falls prevention, AE/DME, and home/routines modifications  Other Exercises: pt amb with RW and CGA to bathroom for toileting task, able to perform transfer w/ CGA, demonstrating good safety awareness and RW use, indep with hygiene Other Exercises: pt stood at sink for grooming tasks with supervision and set up of supplies, no LOB and pt tolerated well with no increase in L knee pain  Shoulder Instructions      Home Living Family/patient expects to be discharged to:: Private residence Living Arrangements: Alone Available Help at Discharge: Family;Friend(s);Available  PRN/intermittently(patient's daughter will be staying at night, others in intermittantly during day) Type of Home: House Home Access: Ramped entrance;Stairs to enter(does not use stairs) Entrance Stairs-Number of Steps: 6 Entrance Stairs-Rails: None(plan to call son tomorrow to put a rail at ramp) Home Layout: Able to live on main level with bedroom/bathroom(basement that is not used)     Bathroom Shower/Tub: Walk-in shower(with built in seat and handle)   Bathroom Toilet: Soil scientist bars installed over seat)     Home Equipment: Wheelchair - manual;Cane - single point;Grab bars - tub/shower;Shower seat - built in;Grab bars - toilet(under toilet seat bars)   Additional Comments: pt feels she will not need a BSC (she has rails installed on her toilet); She reports her bed is 29" tall and she uses a 9" stool to get up on it. Would like to learn how to do this prior to discharge from hospital      Prior Functioning/Environment Level of Independence: Independent        Comments: patient reports she was I with all aspects of mobility and care, although she had to stop working due to worsening knee pain. Last worked 5 hours in Jan. 2019. Loves her job working part time at [employer] and is eager to return when able.         OT Problem List: Decreased strength;Decreased range of motion;Decreased knowledge of use of DME or AE      OT Treatment/Interventions: Self-care/ADL training;Therapeutic exercise;Therapeutic activities;DME and/or  AE instruction;Patient/family education    OT Goals(Current goals can be found in the care plan section) Acute Rehab OT Goals Patient Stated Goal: be able to get in and out of 29" bed using 9" stool; return to PLOF, go home OT Goal Formulation: With patient Time For Goal Achievement: 07/18/18 Potential to Achieve Goals: Good ADL Goals Pt Will Perform Lower Body Dressing: sit to/from stand;with modified independence;with adaptive equipment Additional  ADL Goal #1: Pt will independently instruct family/caregiver in compression stocking mgt including donning/doffing, wear schedule, and positioning. Additional ADL Goal #2: Pt will independently instruct family/caregiver in polar care mgt including donning/doffing, wear schedule, and positioning. Additional ADL Goal #3: Pt will perform bed mobility to 29'' tall bed with supervision assist and no LOB  OT Frequency: Min 1X/week   Barriers to D/C:            Co-evaluation              AM-PAC OT "6 Clicks" Daily Activity     Outcome Measure Help from another person eating meals?: None Help from another person taking care of personal grooming?: None Help from another person toileting, which includes using toliet, bedpan, or urinal?: A Little Help from another person bathing (including washing, rinsing, drying)?: A Little Help from another person to put on and taking off regular upper body clothing?: None Help from another person to put on and taking off regular lower body clothing?: A Little 6 Click Score: 21   End of Session Equipment Utilized During Treatment: Gait belt;Rolling walker  Activity Tolerance: Patient tolerated treatment well Patient left: in bed;with call bell/phone within reach;with bed alarm set;with SCD's reapplied;Other (comment)(LLE in bone foam)  OT Visit Diagnosis: Other abnormalities of gait and mobility (R26.89)                Time: 1056-1130 OT Time Calculation (min): 34 min Charges:  OT General Charges $OT Visit: 1 Visit OT Evaluation $OT Eval Low Complexity: 1 Low OT Treatments $Self Care/Home Management : 23-37 mins  Jeni Salles, MPH, MS, OTR/L ascom [phone] 07/04/18, 11:47 AM

## 2018-07-04 NOTE — TOC Initial Note (Signed)
Transition of Care [facility]) - Initial/Assessment Note    Patient Details  Name: Mackenzie White MRN: [id number] Date of Birth: 1940/10/05  [facility]) CM/SW Contact:    Su Hilt, RN Phone Number: 07/04/2018, 2:59 PM  Clinical Narrative:                 Met with the patient to discuss DC plan and needs She lives alone but will have family come stay with her for as long as she needs She has a Bedside commode at home but needs a RW, notiofied Brad, Patient wants to [facility], notified Helene Kelp Lovenox is $50.00 copay notified the patient and she is ok with the price Transportation will be provided by family for now until she can drive She sees Dr Osborne Casco [facility], she can afford her medications CM to monitor for any further needs  Expected Discharge Plan: [place] Barriers to Discharge: Continued Medical Work up   Patient Goals and CMS Choice Patient states their goals for this hospitalization and ongoing recovery are:: go home CMS Medicare.gov Compare Post Acute Care list provided to:: Patient Choice offered to / list presented to : Patient  Expected Discharge Plan and Services Expected Discharge Plan: [place]   Discharge Planning Services: CM Consult Post Acute Care Choice: [place] arrangements for the past 2 months: Single Family Home Expected Discharge Date: 07/06/18               DME Arranged: Gilford Rile rolling DME Agency: [employer] Date DME Agency Contacted: 07/04/18 Time DME Agency Contacted: [phone] Representative spoke with at DME Agency: [place]: PT [place]: [facility] [employer] Date [place]: 07/04/18 Time [place]: 1458 Representative spoke with at [place]: [place] Arrangements/Services Living arrangements for the past 2 months: [place] Lives with:: Self Patient  language and need for interpreter reviewed:: No Do you feel safe going back to the place where you live?: Yes      Need for Family Participation in Patient Care: No (Comment) Care giver support system in place?: Yes (comment) Current home services: DME(bedside commode) Criminal Activity/Legal Involvement Pertinent to Current Situation/Hospitalization: No - Comment as needed  Activities of Daily Living Home Assistive Devices/Equipment: Wheelchair ADL Screening (condition at time of admission) Patient's cognitive ability adequate to safely complete daily activities?: Yes Is the patient deaf or have difficulty hearing?: No Does the patient have difficulty seeing, even when wearing glasses/contacts?: No Does the patient have difficulty concentrating, remembering, or making decisions?: No Patient able to express need for assistance with ADLs?: Yes Does the patient have difficulty dressing or bathing?: No Independently performs ADLs?: Yes (appropriate for developmental age) Does the patient have difficulty walking or climbing stairs?: Yes Weakness of Legs: Left Weakness of Arms/Hands: None  Permission Sought/Granted                  Emotional Assessment Appearance:: Appears stated age Attitude/Demeanor/Rapport: Engaged Affect (typically observed): Accepting Orientation: : Oriented to Self, Oriented to Place, Oriented to  Time, Oriented to Situation Alcohol / Substance Use: Never Used Psych Involvement: No (comment)  Admission diagnosis:  PRIMARY OSTEOARTHRITIS OF LEFT KNEE Patient Active Problem List   Diagnosis Date Noted  . Depression 07/03/2018  . GERD (gastroesophageal reflux disease) 07/03/2018  . Hypertension 07/03/2018  . Total knee replacement  status 07/03/2018  . Primary osteoarthritis of both knees 04/23/2018   PCP:  Haywood Pao, MD [facility]   [id number] [place] [id number]:  [phone]: [phone]    Social Determinants of Health (SDOH) Interventions    Readmission Risk Interventions No flowsheet data found.

## 2018-07-04 NOTE — Anesthesia Postprocedure Evaluation (Signed)
Anesthesia Post Note  Patient: Mackenzie White  Procedure(s) Performed: COMPUTER ASSISTED TOTAL KNEE ARTHROPLASTY - LEFT (Left )  Patient location during evaluation: Nursing Unit Anesthesia Type: Spinal Level of consciousness: awake and alert and oriented Pain management: pain level controlled Vital Signs Assessment: post-procedure vital signs reviewed and stable Respiratory status: spontaneous breathing Cardiovascular status: stable Postop Assessment: no headache, patient able to bend at knees, no apparent nausea or vomiting, adequate PO intake and able to ambulate Anesthetic complications: no     Last Vitals:  Vitals:   07/04/18 0021 07/04/18 0531  BP: (!) 93/51 (!) 92/51  Pulse: 64 64  Resp: 16 16  Temp: (!) 36.3 C 36.4 C  SpO2: 92% 93%    Last Pain:  Vitals:   07/04/18 0531  TempSrc: Oral  PainSc:                  [name]

## 2018-07-05 NOTE — Progress Notes (Signed)
   Subjective: 2 Days Post-Op Procedure(s) (LRB): COMPUTER ASSISTED TOTAL KNEE ARTHROPLASTY - LEFT (Left) Patient reports pain as 1 on 0-10 scale.   Patient is well, and has had no acute complaints or problems Patient did very well with therapy yesterday.  Was able ambulate around the nurses desk and do steps. Plan is to go Home after hospital stay. no nausea and no vomiting Voicing no complaints. Sleeping off and on Patient denies any chest pains or shortness of breath.  Objective: Vital signs in last 24 hours: Temp:  [97.6 F (36.4 C)-98.5 F (36.9 C)] 97.6 F (36.4 C) (05/20 0403) Pulse Rate:  [64-77] 64 (05/20 0403) Resp:  [16-20] 16 (05/20 0403) BP: (95-128)/(56-71) 114/56 (05/20 0403) SpO2:  [92 %-96 %] 92 % (05/20 0403) well approximated incision Heels are non tender and elevated off the bed using rolled towels Intake/Output from previous day: 05/19 0701 - 05/20 0700 In: 1527.3 [P.O.:360; I.V.:1167.3] Out: 140 [Drains:140] Intake/Output this shift: No intake/output data recorded.  No results for input(s): HGB in the last 72 hours. No results for input(s): WBC, RBC, HCT, PLT in the last 72 hours. No results for input(s): NA, K, CL, CO2, BUN, CREATININE, GLUCOSE, CALCIUM in the last 72 hours. No results for input(s): LABPT, INR in the last 72 hours.  EXAM General - Patient is Alert, Appropriate and Oriented Extremity - Neurologically intact Neurovascular intact Sensation intact distally Intact pulses distally Dorsiflexion/Plantar flexion intact No cellulitis present Compartment soft Dressing - dressing C/D/I Motor Function - intact, moving foot and toes well on exam.    Past Medical History:  Diagnosis Date  . Allergy   . Arthritis   . Cancer ([place])    basal cell skin cancer  . Carpal tunnel syndrome of right wrist   . Cataract    bilateral eyes in 2015  . Depression   . Diverticulosis   . Gallstones   . GERD (gastroesophageal reflux disease)   .  History of gluten intolerance   . History of hiatal hernia    big per pt  . Hyperlipemia   . Hypertension     Assessment/Plan: 2 Days Post-Op Procedure(s) (LRB): COMPUTER ASSISTED TOTAL KNEE ARTHROPLASTY - LEFT (Left) Active Problems:   Total knee replacement status  Estimated body mass index is 29.17 kg/m as calculated from the following:   Height as of this encounter: 5\' 5"  (1.651 m).   Weight as of this encounter: 79.5 kg. Up with therapy Discharge home with home health    Labs: None DVT Prophylaxis - Lovenox, Foot Pumps and TED hose Weight-Bearing as tolerated to left leg Hemovac was discontinued today.  Ends of the drain appeared to be intact. Please wash operative leg, change dressing and apply TED stockings to both legs Please give the patient 2 extra honeycomb dressings to take home Patient may be discharged home once she has a bowel movement.  Mackenzie White. [place] 07/05/2018, 7:45 AM

## 2018-07-05 NOTE — TOC Transition Note (Signed)
Transition of Care (TOC) - CM/SW Discharge Note Patient to DC home today with [facility] for PT, Lovenox price given at 50$, [facility] can [facility] A RW provided by [employer] and in the room, no further needs  Patient Details  Name: Mackenzie White MRN: [id number] Date of Birth: Dec 23, 1940  [facility]) CM/SW Contact:  Su Hilt, RN Phone Number: 07/05/2018, 8:48 AM   Clinical Narrative:       Final next level of care: [place] Barriers to Discharge: Barriers Resolved   Patient Goals and CMS Choice Patient states their goals for this hospitalization and ongoing recovery are:: go home CMS Medicare.gov Compare Post Acute Care list provided to:: Patient Choice offered to / list presented to : Patient  Discharge Placement                       Discharge Plan and Services   Discharge Planning Services: CM Consult Post Acute Care Choice: Home Health          DME Arranged: [place] rolling DME Agency: [employer] Date DME Agency Contacted: 07/04/18 Time DME Agency Contacted: [phone] Representative spoke with at DME Agency: [place]: PT [place]: [facility] [employer] Date [place]: 07/04/18 Time [place]: 1458 Representative spoke with at [place]: [place] ([place]) Interventions     Readmission Risk Interventions No flowsheet data found.

## 2018-07-05 NOTE — Progress Notes (Signed)
Physical Therapy Treatment Patient Details Name: Mackenzie White MRN: [id number] DOB: [birth year] Today's Date: 07/05/2018    History of Present Illness Lakresha Stifter is a 78 y.o. female admitted to the hospital s/p L TKA performed 9/93/7169 with no complications. Relevant PMI includes bilateral knee OA (pt states R needs TKA as well but currently doing well), GERD, HTN, depression.     PT Comments    Patient pleasant and eager to participate in physical therapy. Has increased pain today with noted dry mouth, noted muscle tension with muscle guarding upon AAROM/AROM. Education on breathing for pain control, exhale with painful portion of movement to reduce guarding/holding breath. Patient challenged with prolonged muscle activation with increased fatigue due to excess energy for pain reduction. Patient continues to demonstrate good motivation and progression of available ROM (81 degrees supine 92 degrees sitting with -2 extension). Current POC remains appropriate.     Follow Up Recommendations  Home health PT;Supervision - Intermittent     Equipment Recommendations  Rolling walker with 5" wheels;3in1 (PT)(patient may not want BSC (3 in 1))    Recommendations for Other Services OT consult     Precautions / Restrictions Precautions Precautions: Knee Precaution Comments: no pillow under L knee Restrictions Weight Bearing Restrictions: Yes LLE Weight Bearing: Weight bearing as tolerated    Mobility  Bed Mobility Overal bed mobility: Needs Assistance Bed Mobility: Supine to Sit;Sit to Supine     Supine to sit: Supervision;HOB elevated Sit to supine: Supervision   General bed mobility comments: uses RLE to guide LLE off/on bed, slower due to pain  Transfers Overall transfer level: Needs assistance Equipment used: Rolling walker (2 wheeled) Transfers: Sit to/from Stand Sit to Stand: Min guard         General transfer comment: CGA with hand placement, STS with noted  weight shift onto RLE and UE reliance   Ambulation/Gait         Gait velocity: decreased   General Gait Details: Patient declined due to pain/fatigue from interventions EOB    Stairs             Wheelchair Mobility    Modified Rankin (Stroke Patients Only)       Balance Overall balance assessment: Needs assistance Sitting-balance support: No upper extremity supported;Feet supported Sitting balance-Leahy Scale: Good Sitting balance - Comments: able to sit at EOB without feet touching with close supervision. stable with feet touching.    Standing balance support: During functional activity;Bilateral upper extremity supported Standing balance-Leahy Scale: Good Standing balance comment: limited weight acceptance onto LLE due to pain/muscle guarding                            Cognition Arousal/Alertness: Awake/alert Behavior During Therapy: WFL for tasks assessed/performed Overall Cognitive Status: Within Functional Limits for tasks assessed                                 General Comments: eager to participate with PT, more hesitant about moving LLE due to pain/muscle guarding      Exercises Total Joint Exercises Ankle Circles/Pumps: Strengthening;Both;10 reps;Supine Quad Sets: Strengthening;Left;10 reps;Supine(3 second holds) Short Arc Quad: Strengthening;Left;10 reps;Supine Heel Slides: AAROM;Strengthening;Left;10 reps;Supine Hip ABduction/ADduction: Strengthening;Left;Right;Both;10 reps;Supine Straight Leg Raises: Supine;Right;Left;AROM;10 reps Long Arc Quad: Strengthening;Left;10 reps;Seated Knee Flexion: AAROM;AROM;Left;10 reps;Seated Goniometric ROM: supine AAROM: 81, seated 92 degrees, extension -2 with noted muscle guarding Other Exercises  Other Exercises: reviewed TKR exercise packet again, patient demosntrates/verbalizes understanding Other Exercises: pt instructed in polar care mgt, compression stocking mgt, falls prevention,  AE/DME, and home/routines modifications  Other Exercises: standing weight shift with RW to educate on slow acceptance of weight onto LLE.     General Comments General comments (skin integrity, edema, etc.): ice on L knee.      Pertinent Vitals/Pain Pain Score: 4  Pain Location: left knee, not able to tolerate as well Pain Descriptors / Indicators: Aching;Discomfort;Throbbing;Operative site guarding Pain Intervention(s): Limited activity within patient's tolerance;Monitored during session;Repositioned;Ice applied    Home Living                      Prior Function            PT Goals (current goals can now be found in the care plan section) Acute Rehab PT Goals Patient Stated Goal: be able to get in and out of 29" bed using 9" stool; return to PLOF, go home PT Goal Formulation: With patient Time For Goal Achievement: 07/17/18 Potential to Achieve Goals: Good Additional Goals Additional Goal #1: Patient will demonstrate ability to get on/off surface at least 29" from floor using 9" step and min A in order to demonstrate ability to get in and out of bed safely at home. Progress towards PT goals: Progressing toward goals    Frequency    BID      PT Plan Current plan remains appropriate    Co-evaluation              AM-PAC PT "6 Clicks" Mobility   Outcome Measure  Help needed turning from your back to your side while in a flat bed without using bedrails?: A Little Help needed moving from lying on your back to sitting on the side of a flat bed without using bedrails?: A Little Help needed moving to and from a bed to a chair (including a wheelchair)?: A Little Help needed standing up from a chair using your arms (e.g., wheelchair or bedside chair)?: A Little Help needed to walk in hospital room?: A Little Help needed climbing 3-5 steps with a railing? : A Little 6 Click Score: 18    End of Session Equipment Utilized During Treatment: Gait belt Activity  Tolerance: Patient tolerated treatment well;Patient limited by fatigue;Patient limited by pain Patient left: with call bell/phone within reach;with SCD's reapplied;in bed;with bed alarm set Nurse Communication: Mobility status;Weight bearing status PT Visit Diagnosis: Unsteadiness on feet (R26.81);Muscle weakness (generalized) (M62.81);Difficulty in walking, not elsewhere classified (R26.2);Pain Pain - Right/Left: Left Pain - part of body: Knee     Time: 1000-1029 PT Time Calculation (min) (ACUTE ONLY): 29 min  Charges:  $Therapeutic Exercise: 8-22 mins $Therapeutic Activity: 8-22 mins                     Janna Arch, PT, DPT    Janna Arch 07/05/2018, 11:12 AM

## 2018-07-05 NOTE — Progress Notes (Signed)
Discharge instructions reviewed with patient. Verbalizes understanding. IV removed. Stable condition. Patient is about to be taken down to medical mall.

## 2018-11-15 ENCOUNTER — Other Ambulatory Visit: Payer: Self-pay

## 2018-11-15 ENCOUNTER — Encounter
Admission: RE | Admit: 2018-11-15 | Discharge: 2018-11-15 | Disposition: A | Discharge status: Home / Self Care | Payer: Medicare Other | Source: Ambulatory Visit | Attending: Orthopedic Surgery | Admitting: Orthopedic Surgery

## 2018-11-15 DIAGNOSIS — Z01812 Encounter for preprocedural laboratory examination: Secondary | ICD-10-CM | POA: Insufficient documentation

## 2018-11-15 LAB — COMPREHENSIVE METABOLIC PANEL
ALT: 22 U/L (ref 0–44)
AST: 35 U/L (ref 15–41)
Albumin: 4.1 g/dL (ref 3.5–5.0)
Alkaline Phosphatase: 80 U/L (ref 38–126)
Anion gap: 11 (ref 5–15)
BUN: 15 mg/dL (ref 8–23)
CO2: 26 mmol/L (ref 22–32)
Calcium: 9.4 mg/dL (ref 8.9–10.3)
Chloride: 101 mmol/L (ref 98–111)
Creatinine, Ser: 0.84 mg/dL (ref 0.44–1.00)
GFR calc Af Amer: >60 mL/min (ref >60)
GFR calc non Af Amer: >60 mL/min (ref >60)
Glucose, Bld: 108 mg/dL — ABNORMAL HIGH (ref 70–99)
Potassium: 4.1 mmol/L (ref 3.5–5.1)
Sodium: 138 mmol/L (ref 135–145)
Total Bilirubin: 0.6 mg/dL (ref 0.3–1.2)
Total Protein: 7.5 g/dL (ref 6.5–8.1)

## 2018-11-15 LAB — TYPE AND SCREEN
ABO/RH(D): A NEG
Antibody Screen: NEGATIVE

## 2018-11-15 LAB — PROTIME-INR
INR: 1 (ref 0.8–1.2)
Prothrombin Time: 13.4 seconds (ref 11.4–15.2)

## 2018-11-15 LAB — CBC
HCT: 39.6 % (ref 36.0–46.0)
Hemoglobin: 12.6 g/dL (ref 12.0–15.0)
MCH: 26.1 pg (ref 26.0–34.0)
MCHC: 31.8 g/dL (ref 30.0–36.0)
MCV: 82 fL (ref 80.0–100.0)
Platelets: 315 10*3/uL (ref 150–400)
RBC: 4.83 MIL/uL (ref 3.87–5.11)
RDW: 15.1 % (ref 11.5–15.5)
WBC: 6.3 10*3/uL (ref 4.0–10.5)
nRBC: 0 % (ref 0.0–0.2)

## 2018-11-15 LAB — URINALYSIS, ROUTINE W REFLEX MICROSCOPIC
Bilirubin Urine: NEGATIVE
Glucose, UA: NEGATIVE mg/dL
Hgb urine dipstick: NEGATIVE
Ketones, ur: NEGATIVE mg/dL
Leukocytes,Ua: NEGATIVE
Nitrite: NEGATIVE
Protein, ur: NEGATIVE mg/dL
Specific Gravity, Urine: 1.016 (ref 1.005–1.030)
pH: 6 (ref 5.0–8.0)

## 2018-11-15 LAB — SURGICAL PCR SCREEN
MRSA, PCR: NEGATIVE
Staphylococcus aureus: NEGATIVE

## 2018-11-15 LAB — C-REACTIVE PROTEIN: CRP: 1.1 mg/dL — ABNORMAL HIGH (ref <1.0)

## 2018-11-15 LAB — SEDIMENTATION RATE: Sed Rate: 15 mm/hr (ref 0–30)

## 2018-11-15 LAB — APTT: aPTT: 32 seconds (ref 24–36)

## 2018-11-15 MED ORDER — ENSURE PRE-SURGERY PO LIQD
296.0000 mL | Freq: Once | ORAL | Status: DC
  Filled 2018-11-15: qty 296

## 2018-11-15 NOTE — Pre-Procedure Instructions (Signed)
Incentive spirometry and carbohydrate drink given along with instructions.

## 2018-11-15 NOTE — Patient Instructions (Signed)
Your procedure is scheduled on: 11/22/2018 Wed Report to Same Day Surgery 2nd floor medical mall [facility] Entrance-take elevator on left to 2nd floor.  Check in with surgery information desk.) To find out your arrival time please call [phone] between 1PM - 3PM on 11/21/2018 Tues  Remember: Instructions that are not followed completely may result in serious medical risk, up to and including death, or upon the discretion of your surgeon and anesthesiologist your surgery may need to be rescheduled.    _x___ 1. Do not eat food after midnight the night before your procedure. You may drink clear liquids up to 2 hours before you are scheduled to arrive at the hospital for your procedure.  Do not drink clear liquids within 2 hours of your scheduled arrival to the hospital.  Clear liquids include  --Water or Apple juice without pulp  --Clear carbohydrate beverage such as ClearFast or Gatorade  --Black Coffee or Clear Tea (No milk, no creamers, do not add anything to                  the coffee or Tea Type 1 and type 2 diabetics should only drink water.   ____Ensure clear carbohydrate drink on the way to the hospital for bariatric patients  __x__Ensure clear carbohydrate drink 3 hours before surgery.   No gum chewing or hard candies.     __x__ 2. No Alcohol for 24 hours before or after surgery.   __x__3. No Smoking or e-cigarettes for 24 prior to surgery.  Do not use any chewable tobacco products for at least 6 hour prior to surgery   ____  4. Bring all medications with you on the day of surgery if instructed.    __x__ 5. Notify your doctor if there is any change in your medical condition     (cold, fever, infections).    x___6. On the morning of surgery brush your teeth with toothpaste and water.  You may rinse your mouth with mouth wash if you wish.  Do not swallow any toothpaste or mouthwash.   Do not wear jewelry, make-up, hairpins, clips or nail polish.  Do not wear lotions,  powders, or perfumes. You may wear deodorant.  Do not shave 48 hours prior to surgery. Men may shave face and neck.  Do not bring valuables to the hospital.    [facility] is not responsible for any belongings or valuables.               Contacts, dentures or bridgework may not be worn into surgery.  Leave your suitcase in the car. After surgery it may be brought to your room.  For patients admitted to the hospital, discharge time is determined by your                       treatment team.  _  Patients discharged the day of surgery will not be allowed to drive home.  You will need someone to drive you home and stay with you the night of your procedure.    Please read over the following fact sheets that you were given:   [facility] Preparing for Surgery and or MRSA Information   _x___ Take anti-hypertensive listed below, cardiac, seizure, asthma,     anti-reflux and psychiatric medicines. These include:  1. ALPRAZolam (XANAX) 0.25 MG tablet  2.DULoxetine (CYMBALTA) 60 MG capsule  3.fluticasone (FLONASE) 50 MCG/ACT nasal spray if needed  4.NEXIUM 40 MG capsule  5.pain med if needed  6.  ____Fleets enema or Magnesium Citrate as directed.   _x___ Use CHG Soap or sage wipes as directed on instruction sheet   ____ Use inhalers on the day of surgery and bring to hospital day of surgery  ____ Stop Metformin and Janumet 2 days prior to surgery.    ____ Take 1/2 of usual insulin dose the night before surgery and none on the morning     surgery.   _x___ Follow recommendations from Cardiologist, Pulmonologist or PCP regarding          stopping Aspirin, Coumadin, Plavix ,Eliquis, Effient, or Pradaxa, and Pletal.  X____Stop Anti-inflammatories such as Advil, Aleve, Ibuprofen, Motrin, Naproxen, Naprosyn, Goodies powders or aspirin products. OK to take Tylenol and                          Celebrex.   _x___ Stop supplements until after surgery.  But may continue Vitamin D, Vitamin B,       and  multivitamin.   ____ Bring C-Pap to the hospital.

## 2018-11-16 LAB — URINE CULTURE
Culture: NO GROWTH
Special Requests: NORMAL

## 2018-11-17 ENCOUNTER — Other Ambulatory Visit
Admission: RE | Admit: 2018-11-17 | Discharge: 2018-11-17 | Disposition: A | Discharge status: Home / Self Care | Payer: Medicare Other | Source: Ambulatory Visit | Attending: Orthopedic Surgery | Admitting: Orthopedic Surgery

## 2018-11-17 ENCOUNTER — Other Ambulatory Visit: Payer: Self-pay

## 2018-11-17 DIAGNOSIS — Z01812 Encounter for preprocedural laboratory examination: Secondary | ICD-10-CM | POA: Insufficient documentation

## 2018-11-17 DIAGNOSIS — Z20828 Contact with and (suspected) exposure to other viral communicable diseases: Secondary | ICD-10-CM | POA: Diagnosis not present

## 2018-11-17 LAB — SARS CORONAVIRUS 2 (TAT 6-24 HRS): SARS Coronavirus 2: NEGATIVE

## 2018-11-17 NOTE — Pre-Procedure Instructions (Signed)
CRP results sent to Dr. Hooten for review. 

## 2018-11-21 MED ORDER — TRANEXAMIC ACID-NACL 1000-0.7 MG/100ML-% IV SOLN
1000.0000 mg | INTRAVENOUS | Status: DC
  Filled 2018-11-21: qty 100

## 2018-11-21 MED ORDER — CLINDAMYCIN PHOSPHATE 900 MG/50ML IV SOLN: 900.0000 mg | INTRAVENOUS | Status: DC

## 2018-11-22 ENCOUNTER — Inpatient Hospital Stay: Payer: Medicare Other | Admitting: Anesthesiology

## 2018-11-22 ENCOUNTER — Encounter: Payer: Self-pay | Admitting: Orthopedic Surgery

## 2018-11-22 ENCOUNTER — Inpatient Hospital Stay
Admission: RE | Admit: 2018-11-22 | Discharge: 2018-11-24 | DRG: 470 | Disposition: A | Discharge status: Home Health | Payer: Medicare Other | Attending: Orthopedic Surgery | Admitting: Orthopedic Surgery

## 2018-11-22 ENCOUNTER — Inpatient Hospital Stay: Payer: Medicare Other

## 2018-11-22 ENCOUNTER — Encounter
Admission: RE | Disposition: A | Discharge status: Home Health | Payer: Self-pay | Source: Home / Self Care | Attending: Orthopedic Surgery

## 2018-11-22 ENCOUNTER — Other Ambulatory Visit: Payer: Self-pay

## 2018-11-22 DIAGNOSIS — I10 Essential (primary) hypertension: Secondary | ICD-10-CM | POA: Diagnosis present

## 2018-11-22 DIAGNOSIS — K219 Gastro-esophageal reflux disease without esophagitis: Secondary | ICD-10-CM | POA: Diagnosis present

## 2018-11-22 DIAGNOSIS — Z96659 Presence of unspecified artificial knee joint: Secondary | ICD-10-CM

## 2018-11-22 DIAGNOSIS — E785 Hyperlipidemia, unspecified: Secondary | ICD-10-CM | POA: Diagnosis present

## 2018-11-22 DIAGNOSIS — M1711 Unilateral primary osteoarthritis, right knee: Secondary | ICD-10-CM | POA: Diagnosis present

## 2018-11-22 DIAGNOSIS — Z79899 Other long term (current) drug therapy: Secondary | ICD-10-CM | POA: Diagnosis not present

## 2018-11-22 HISTORY — PX: KNEE ARTHROPLASTY: SHX992

## 2018-11-22 SURGERY — ARTHROPLASTY, KNEE, TOTAL, USING IMAGELESS COMPUTER-ASSISTED NAVIGATION
Anesthesia: Spinal | Site: Knee | Laterality: Right

## 2018-11-22 MED ORDER — CELECOXIB 200 MG PO CAPS
200.0000 mg | ORAL_CAPSULE | Freq: Two times a day (BID) | ORAL | Status: DC
  Administered 2018-11-23 – 2018-11-24 (×3): 200 mg via ORAL
  Filled 2018-11-22 (×4): qty 1

## 2018-11-22 MED ORDER — ALUM & MAG HYDROXIDE-SIMETH 200-200-20 MG/5ML PO SUSP: 30.0000 mL | ORAL | Status: DC | PRN

## 2018-11-22 MED ORDER — FLUTICASONE PROPIONATE 50 MCG/ACT NA SUSP
1.0000 | Freq: Every day | NASAL | Status: DC | PRN
  Filled 2018-11-22: qty 16

## 2018-11-22 MED ORDER — OXYCODONE HCL 5 MG PO TABS
10.0000 mg | ORAL_TABLET | ORAL | Status: DC | PRN
  Filled 2018-11-22: qty 2

## 2018-11-22 MED ORDER — ONDANSETRON HCL 4 MG PO TABS: 4.0000 mg | ORAL_TABLET | Freq: Four times a day (QID) | ORAL | Status: DC | PRN

## 2018-11-22 MED ORDER — SENNOSIDES-DOCUSATE SODIUM 8.6-50 MG PO TABS
1.0000 | ORAL_TABLET | Freq: Two times a day (BID) | ORAL | Status: DC
  Administered 2018-11-22 – 2018-11-24 (×4): 1 via ORAL
  Filled 2018-11-22 (×4): qty 1

## 2018-11-22 MED ORDER — FENTANYL CITRATE (PF) 100 MCG/2ML IJ SOLN
INTRAMUSCULAR | Status: AC
  Filled 2018-11-22: qty 2

## 2018-11-22 MED ORDER — PROPOFOL 500 MG/50ML IV EMUL
INTRAVENOUS | Status: DC | PRN
  Administered 2018-11-22: 75 ug/kg/min via INTRAVENOUS

## 2018-11-22 MED ORDER — RISAQUAD PO CAPS
1.0000 | ORAL_CAPSULE | Freq: Every day | ORAL | Status: DC
  Administered 2018-11-23 – 2018-11-24 (×2): 1 via ORAL
  Filled 2018-11-22 (×2): qty 1

## 2018-11-22 MED ORDER — FERROUS SULFATE 325 (65 FE) MG PO TABS
325.0000 mg | ORAL_TABLET | Freq: Two times a day (BID) | ORAL | Status: DC
  Administered 2018-11-23 – 2018-11-24 (×3): 325 mg via ORAL
  Filled 2018-11-22 (×3): qty 1

## 2018-11-22 MED ORDER — HYDROMORPHONE HCL 1 MG/ML IJ SOLN: 0.5000 mg | INTRAMUSCULAR | Status: DC | PRN

## 2018-11-22 MED ORDER — PROPOFOL 10 MG/ML IV BOLUS
INTRAVENOUS | Status: DC | PRN
  Administered 2018-11-22: 40 mg via INTRAVENOUS

## 2018-11-22 MED ORDER — ONDANSETRON HCL 4 MG/2ML IJ SOLN
INTRAMUSCULAR | Status: DC | PRN
  Administered 2018-11-22: 4 mg via INTRAVENOUS

## 2018-11-22 MED ORDER — SODIUM CHLORIDE 0.9 % IV SOLN
INTRAVENOUS | Status: DC
  Administered 2018-11-22 – 2018-11-23 (×2): via INTRAVENOUS

## 2018-11-22 MED ORDER — TRANEXAMIC ACID-NACL 1000-0.7 MG/100ML-% IV SOLN
1000.0000 mg | Freq: Once | INTRAVENOUS | Status: AC
  Administered 2018-11-22: 13:00:00 1000 mg via INTRAVENOUS
  Filled 2018-11-22: qty 100

## 2018-11-22 MED ORDER — OXYCODONE HCL 5 MG PO TABS
5.0000 mg | ORAL_TABLET | ORAL | Status: DC | PRN
  Administered 2018-11-22 – 2018-11-24 (×3): 5 mg via ORAL
  Filled 2018-11-22 (×2): qty 1

## 2018-11-22 MED ORDER — SODIUM CHLORIDE 0.9 % IV SOLN
INTRAVENOUS | Status: DC | PRN
  Administered 2018-11-22: 40 ug/min via INTRAVENOUS

## 2018-11-22 MED ORDER — DIPHENHYDRAMINE HCL 12.5 MG/5ML PO ELIX: 12.5000 mg | ORAL_SOLUTION | ORAL | Status: DC | PRN

## 2018-11-22 MED ORDER — CELECOXIB 200 MG PO CAPS
400.0000 mg | ORAL_CAPSULE | Freq: Once | ORAL | Status: AC
  Administered 2018-11-22: 08:00:00 400 mg via ORAL

## 2018-11-22 MED ORDER — HYDROCHLOROTHIAZIDE 12.5 MG PO CAPS
12.5000 mg | ORAL_CAPSULE | Freq: Every day | ORAL | Status: DC
  Administered 2018-11-23: 23:00:00 12.5 mg via ORAL
  Filled 2018-11-22: qty 1

## 2018-11-22 MED ORDER — DIPHENHYDRAMINE HCL 25 MG PO CAPS
25.0000 mg | ORAL_CAPSULE | Freq: Every day | ORAL | Status: DC
  Administered 2018-11-22 – 2018-11-23 (×2): 25 mg via ORAL
  Filled 2018-11-22 (×2): qty 1

## 2018-11-22 MED ORDER — FENTANYL CITRATE (PF) 100 MCG/2ML IJ SOLN
INTRAMUSCULAR | Status: DC | PRN
  Administered 2018-11-22 (×2): 50 ug via INTRAVENOUS

## 2018-11-22 MED ORDER — ALPRAZOLAM 0.25 MG PO TABS: 0.2500 mg | ORAL_TABLET | Freq: Every day | ORAL | Status: DC | PRN

## 2018-11-22 MED ORDER — MAGNESIUM HYDROXIDE 400 MG/5ML PO SUSP
30.0000 mL | Freq: Every day | ORAL | Status: DC
  Administered 2018-11-23 – 2018-11-24 (×2): 30 mL via ORAL
  Filled 2018-11-22 (×2): qty 30

## 2018-11-22 MED ORDER — ACETAMINOPHEN 10 MG/ML IV SOLN
INTRAVENOUS | Status: DC | PRN
  Administered 2018-11-22: 1000 mg via INTRAVENOUS

## 2018-11-22 MED ORDER — METOCLOPRAMIDE HCL 5 MG/ML IJ SOLN: 5.0000 mg | Freq: Three times a day (TID) | INTRAMUSCULAR | Status: DC | PRN

## 2018-11-22 MED ORDER — ONDANSETRON HCL 4 MG/2ML IJ SOLN: 4.0000 mg | Freq: Four times a day (QID) | INTRAMUSCULAR | Status: DC | PRN

## 2018-11-22 MED ORDER — MIDAZOLAM HCL 2 MG/2ML IJ SOLN
INTRAMUSCULAR | Status: AC
  Filled 2018-11-22: qty 2

## 2018-11-22 MED ORDER — ONDANSETRON HCL 4 MG/2ML IJ SOLN: 4.0000 mg | Freq: Once | INTRAMUSCULAR | Status: DC | PRN

## 2018-11-22 MED ORDER — ACETAMINOPHEN 325 MG PO TABS: 325.0000 mg | ORAL_TABLET | Freq: Four times a day (QID) | ORAL | Status: DC | PRN

## 2018-11-22 MED ORDER — MIDAZOLAM HCL 5 MG/5ML IJ SOLN
INTRAMUSCULAR | Status: DC | PRN
  Administered 2018-11-22: 2 mg via INTRAVENOUS

## 2018-11-22 MED ORDER — GABAPENTIN 300 MG PO CAPS
300.0000 mg | ORAL_CAPSULE | Freq: Every day | ORAL | Status: DC
  Administered 2018-11-22 – 2018-11-23 (×2): 300 mg via ORAL
  Filled 2018-11-22 (×2): qty 1

## 2018-11-22 MED ORDER — ACETAMINOPHEN 10 MG/ML IV SOLN
1000.0000 mg | Freq: Four times a day (QID) | INTRAVENOUS | Status: AC
  Administered 2018-11-22 – 2018-11-23 (×4): 1000 mg via INTRAVENOUS
  Filled 2018-11-22 (×4): qty 100

## 2018-11-22 MED ORDER — CALCIUM CARBONATE-VITAMIN D 500-200 MG-UNIT PO TABS
1.0000 | ORAL_TABLET | Freq: Every day | ORAL | Status: DC
  Administered 2018-11-22 – 2018-11-23 (×2): 1 via ORAL
  Filled 2018-11-22 (×2): qty 1

## 2018-11-22 MED ORDER — BUPIVACAINE HCL (PF) 0.25 % IJ SOLN
INTRAMUSCULAR | Status: AC
  Filled 2018-11-22: qty 60

## 2018-11-22 MED ORDER — FENTANYL CITRATE (PF) 100 MCG/2ML IJ SOLN: 25.0000 ug | INTRAMUSCULAR | Status: DC | PRN

## 2018-11-22 MED ORDER — CLINDAMYCIN PHOSPHATE 900 MG/50ML IV SOLN
INTRAVENOUS | Status: AC
  Filled 2018-11-22: qty 50

## 2018-11-22 MED ORDER — PANTOPRAZOLE SODIUM 40 MG PO TBEC
40.0000 mg | DELAYED_RELEASE_TABLET | Freq: Two times a day (BID) | ORAL | Status: DC
  Administered 2018-11-22 – 2018-11-24 (×4): 40 mg via ORAL
  Filled 2018-11-22 (×4): qty 1

## 2018-11-22 MED ORDER — NEOMYCIN-POLYMYXIN B GU 40-200000 IR SOLN
Status: DC | PRN
  Administered 2018-11-22: 14 mL

## 2018-11-22 MED ORDER — BUPIVACAINE LIPOSOME 1.3 % IJ SUSP
INTRAMUSCULAR | Status: AC
  Filled 2018-11-22: qty 20

## 2018-11-22 MED ORDER — ATORVASTATIN CALCIUM 20 MG PO TABS
40.0000 mg | ORAL_TABLET | Freq: Every day | ORAL | Status: DC
  Administered 2018-11-22 – 2018-11-23 (×2): 40 mg via ORAL
  Filled 2018-11-22 (×2): qty 2

## 2018-11-22 MED ORDER — NEOMYCIN-POLYMYXIN B GU 40-200000 IR SOLN
Status: AC
  Filled 2018-11-22: qty 1

## 2018-11-22 MED ORDER — CLINDAMYCIN PHOSPHATE 900 MG/50ML IV SOLN
INTRAVENOUS | Status: DC | PRN
  Administered 2018-11-22: 900 mg via INTRAVENOUS

## 2018-11-22 MED ORDER — PROPOFOL 500 MG/50ML IV EMUL
INTRAVENOUS | Status: AC
  Filled 2018-11-22: qty 50

## 2018-11-22 MED ORDER — METOCLOPRAMIDE HCL 10 MG PO TABS
10.0000 mg | ORAL_TABLET | Freq: Three times a day (TID) | ORAL | Status: AC
  Administered 2018-11-22 – 2018-11-24 (×8): 10 mg via ORAL
  Filled 2018-11-22 (×8): qty 1

## 2018-11-22 MED ORDER — MENTHOL 3 MG MT LOZG
1.0000 | LOZENGE | OROMUCOSAL | Status: DC | PRN
  Filled 2018-11-22: qty 9

## 2018-11-22 MED ORDER — SIMETHICONE 80 MG PO CHEW
160.0000 mg | CHEWABLE_TABLET | Freq: Every day | ORAL | Status: DC | PRN
  Filled 2018-11-22: qty 2

## 2018-11-22 MED ORDER — BUPIVACAINE HCL (PF) 0.5 % IJ SOLN
INTRAMUSCULAR | Status: DC | PRN
  Administered 2018-11-22: 3 mL

## 2018-11-22 MED ORDER — CHLORHEXIDINE GLUCONATE CLOTH 2 % EX PADS
6.0000 | MEDICATED_PAD | Freq: Every day | CUTANEOUS | Status: DC
  Administered 2018-11-22 – 2018-11-23 (×2): 6 via TOPICAL

## 2018-11-22 MED ORDER — BUPIVACAINE HCL (PF) 0.25 % IJ SOLN
INTRAMUSCULAR | Status: DC | PRN
  Administered 2018-11-22: 60 mL

## 2018-11-22 MED ORDER — LISINOPRIL 20 MG PO TABS
20.0000 mg | ORAL_TABLET | Freq: Every day | ORAL | Status: DC
  Administered 2018-11-24: 20 mg via ORAL
  Filled 2018-11-22: qty 1

## 2018-11-22 MED ORDER — SODIUM CHLORIDE FLUSH 0.9 % IV SOLN
INTRAVENOUS | Status: AC
  Filled 2018-11-22: qty 40

## 2018-11-22 MED ORDER — SODIUM CHLORIDE (PF) 0.9 % IJ SOLN
INTRAMUSCULAR | Status: AC
  Filled 2018-11-22: qty 50

## 2018-11-22 MED ORDER — PROPOFOL 10 MG/ML IV BOLUS
INTRAVENOUS | Status: AC
  Filled 2018-11-22: qty 20

## 2018-11-22 MED ORDER — CELECOXIB 200 MG PO CAPS
ORAL_CAPSULE | ORAL | Status: AC
  Administered 2018-11-22: 08:00:00 400 mg via ORAL
  Filled 2018-11-22: qty 2

## 2018-11-22 MED ORDER — DEXAMETHASONE SODIUM PHOSPHATE 10 MG/ML IJ SOLN
INTRAMUSCULAR | Status: AC
  Administered 2018-11-22: 8 mg via INTRAVENOUS
  Filled 2018-11-22: qty 1

## 2018-11-22 MED ORDER — LISINOPRIL-HYDROCHLOROTHIAZIDE 20-12.5 MG PO TABS: 1.0000 | ORAL_TABLET | Freq: Every day | ORAL | Status: DC

## 2018-11-22 MED ORDER — CLINDAMYCIN PHOSPHATE 600 MG/50ML IV SOLN
600.0000 mg | Freq: Four times a day (QID) | INTRAVENOUS | Status: AC
  Administered 2018-11-22 – 2018-11-23 (×4): 600 mg via INTRAVENOUS
  Filled 2018-11-22 (×4): qty 50

## 2018-11-22 MED ORDER — FLEET ENEMA 7-19 GM/118ML RE ENEM: 1.0000 | ENEMA | Freq: Once | RECTAL | Status: DC | PRN

## 2018-11-22 MED ORDER — TRANEXAMIC ACID-NACL 1000-0.7 MG/100ML-% IV SOLN
INTRAVENOUS | Status: DC | PRN
  Administered 2018-11-22: 1000 mg via INTRAVENOUS

## 2018-11-22 MED ORDER — TRAMADOL HCL 50 MG PO TABS
50.0000 mg | ORAL_TABLET | ORAL | Status: DC | PRN
  Administered 2018-11-22 – 2018-11-23 (×2): 50 mg via ORAL
  Filled 2018-11-22 (×2): qty 1

## 2018-11-22 MED ORDER — LACTATED RINGERS IV SOLN
INTRAVENOUS | Status: DC
  Administered 2018-11-22 (×3): via INTRAVENOUS

## 2018-11-22 MED ORDER — GABAPENTIN 300 MG PO CAPS
300.0000 mg | ORAL_CAPSULE | Freq: Once | ORAL | Status: AC
  Administered 2018-11-22: 08:00:00 300 mg via ORAL

## 2018-11-22 MED ORDER — ENOXAPARIN SODIUM 30 MG/0.3ML ~~LOC~~ SOLN
30.0000 mg | Freq: Two times a day (BID) | SUBCUTANEOUS | Status: DC
  Administered 2018-11-23 – 2018-11-24 (×3): 30 mg via SUBCUTANEOUS
  Filled 2018-11-22 (×3): qty 0.3

## 2018-11-22 MED ORDER — CHLORHEXIDINE GLUCONATE 4 % EX LIQD: 60.0000 mL | Freq: Once | CUTANEOUS | Status: DC

## 2018-11-22 MED ORDER — DEXAMETHASONE SODIUM PHOSPHATE 10 MG/ML IJ SOLN
8.0000 mg | Freq: Once | INTRAMUSCULAR | Status: AC
  Administered 2018-11-22: 08:00:00 8 mg via INTRAVENOUS

## 2018-11-22 MED ORDER — BISACODYL 10 MG RE SUPP
10.0000 mg | Freq: Every day | RECTAL | Status: DC | PRN
  Administered 2018-11-24: 10 mg via RECTAL
  Filled 2018-11-22: qty 1

## 2018-11-22 MED ORDER — SODIUM CHLORIDE 0.9 % IV SOLN
INTRAVENOUS | Status: DC | PRN
  Administered 2018-11-22: 20 mL

## 2018-11-22 MED ORDER — METOCLOPRAMIDE HCL 10 MG PO TABS: 5.0000 mg | ORAL_TABLET | Freq: Three times a day (TID) | ORAL | Status: DC | PRN

## 2018-11-22 MED ORDER — DULOXETINE HCL 60 MG PO CPEP
60.0000 mg | ORAL_CAPSULE | Freq: Every day | ORAL | Status: DC
  Administered 2018-11-23 – 2018-11-24 (×2): 60 mg via ORAL
  Filled 2018-11-22 (×2): qty 1

## 2018-11-22 MED ORDER — GABAPENTIN 300 MG PO CAPS
ORAL_CAPSULE | ORAL | Status: AC
  Administered 2018-11-22: 08:00:00 300 mg via ORAL
  Filled 2018-11-22: qty 1

## 2018-11-22 MED ORDER — PHENOL 1.4 % MT LIQD
1.0000 | OROMUCOSAL | Status: DC | PRN
  Filled 2018-11-22: qty 177

## 2018-11-22 MED ORDER — ACETAMINOPHEN 10 MG/ML IV SOLN
INTRAVENOUS | Status: AC
  Filled 2018-11-22: qty 100

## 2018-11-22 SURGICAL SUPPLY — 78 items
ATTUNE MED DOME PAT 38 KNEE (Knees) ×1 IMPLANT
ATTUNE MED DOME PAT 38MM KNEE (Knees) ×1 IMPLANT
ATTUNE PSFEM RTSZ5 NARCEM KNEE (Femur) ×2 IMPLANT
ATTUNE PSRP INSR SZ5 5 KNEE (Insert) ×1 IMPLANT
ATTUNE PSRP INSR SZ5 5MM KNEE (Insert) ×1 IMPLANT
BASE TIBIAL ROT PLAT SZ 5 KNEE (Knees) IMPLANT
BATTERY INSTRU NAVIGATION (MISCELLANEOUS) ×12 IMPLANT
BLADE SAW 70X12.5 (BLADE) ×3 IMPLANT
BLADE SAW 90X13X1.19 OSCILLAT (BLADE) ×3 IMPLANT
BLADE SAW 90X25X1.19 OSCILLAT (BLADE) ×3 IMPLANT
BSPLAT TIB 5 CMNT ROT PLAT STR (Knees) ×1 IMPLANT
BTRY SRG DRVR LF (MISCELLANEOUS) ×4
CANISTER SUCT 3000ML PPV (MISCELLANEOUS) ×3 IMPLANT
CEMENT HV SMART SET (Cement) ×6 IMPLANT
COOLER POLAR GLACIER W/PUMP (MISCELLANEOUS) ×3 IMPLANT
COVER WAND RF STERILE (DRAPES) ×3 IMPLANT
CUFF TOURN SGL QUICK 24 (TOURNIQUET CUFF)
CUFF TOURN SGL QUICK 30 (TOURNIQUET CUFF)
CUFF TRNQT CYL 24X4X16.5-23 (TOURNIQUET CUFF) IMPLANT
CUFF TRNQT CYL 30X4X21-28X (TOURNIQUET CUFF) IMPLANT
DRAPE 3/4 80X56 (DRAPES) ×3 IMPLANT
DRSG DERMACEA 8X12 NADH (GAUZE/BANDAGES/DRESSINGS) ×5 IMPLANT
DRSG OPSITE POSTOP 4X14 (GAUZE/BANDAGES/DRESSINGS) ×3 IMPLANT
DRSG TEGADERM 4X4.75 (GAUZE/BANDAGES/DRESSINGS) ×3 IMPLANT
DURAPREP 26ML APPLICATOR (WOUND CARE) ×6 IMPLANT
ELECT REM PT RETURN 9FT ADLT (ELECTROSURGICAL) ×3
ELECTRODE REM PT RTRN 9FT ADLT (ELECTROSURGICAL) ×1 IMPLANT
EX-PIN ORTHOLOCK NAV 4X150 (PIN) ×6 IMPLANT
GLOVE BIO SURGEON STRL SZ7.5 (GLOVE) ×6 IMPLANT
GLOVE BIOGEL M STRL SZ7.5 (GLOVE) ×6 IMPLANT
GLOVE BIOGEL PI IND STRL 7.5 (GLOVE) ×1 IMPLANT
GLOVE BIOGEL PI INDICATOR 7.5 (GLOVE) ×2
GLOVE INDICATOR 8.0 STRL GRN (GLOVE) ×3 IMPLANT
GOWN STRL REUS W/ TWL LRG LVL3 (GOWN DISPOSABLE) ×2 IMPLANT
GOWN STRL REUS W/ TWL XL LVL3 (GOWN DISPOSABLE) ×1 IMPLANT
GOWN STRL REUS W/TWL LRG LVL3 (GOWN DISPOSABLE) ×6
GOWN STRL REUS W/TWL XL LVL3 (GOWN DISPOSABLE) ×3
HEMOVAC 400CC 10FR (MISCELLANEOUS) ×3 IMPLANT
HOLDER FOLEY CATH W/STRAP (MISCELLANEOUS) ×3 IMPLANT
HOOD PEEL AWAY FLYTE STAYCOOL (MISCELLANEOUS) ×6 IMPLANT
KIT TURNOVER KIT A (KITS) ×3 IMPLANT
KNIFE SCULPS 14X20 (INSTRUMENTS) ×3 IMPLANT
LABEL OR SOLS (LABEL) ×3 IMPLANT
MANIFOLD NEPTUNE II (INSTRUMENTS) ×3 IMPLANT
NDL SAFETY ECLIPSE 18X1.5 (NEEDLE) ×1 IMPLANT
NDL SPNL 20GX3.5 QUINCKE YW (NEEDLE) ×2 IMPLANT
NEEDLE HYPO 18GX1.5 SHARP (NEEDLE) ×3
NEEDLE SPNL 20GX3.5 QUINCKE YW (NEEDLE) ×6 IMPLANT
NS IRRIG 500ML POUR BTL (IV SOLUTION) ×3 IMPLANT
PACK TOTAL KNEE (MISCELLANEOUS) ×3 IMPLANT
PAD WRAPON POLAR KNEE (MISCELLANEOUS) ×1 IMPLANT
PENCIL SMOKE ULTRAEVAC 22 CON (MISCELLANEOUS) ×3 IMPLANT
PIN FIXATION 1/8DIA X 3INL (PIN) ×9 IMPLANT
PULSAVAC PLUS IRRIG FAN TIP (DISPOSABLE) ×3
SOL .9 NS 3000ML IRR  AL (IV SOLUTION) ×2
SOL .9 NS 3000ML IRR AL (IV SOLUTION) ×1
SOL .9 NS 3000ML IRR UROMATIC (IV SOLUTION) ×1 IMPLANT
SOL PREP PVP 2OZ (MISCELLANEOUS) ×3
SOLUTION PREP PVP 2OZ (MISCELLANEOUS) ×1 IMPLANT
SPONGE DRAIN TRACH 4X4 STRL 2S (GAUZE/BANDAGES/DRESSINGS) ×3 IMPLANT
STAPLER SKIN PROX 35W (STAPLE) ×3 IMPLANT
STOCKINETTE IMPERV 14X48 (MISCELLANEOUS) IMPLANT
STRAP TIBIA SHORT (MISCELLANEOUS) ×3 IMPLANT
SUCTION FRAZIER HANDLE 10FR (MISCELLANEOUS) ×2
SUCTION TUBE FRAZIER 10FR DISP (MISCELLANEOUS) ×1 IMPLANT
SUT VIC AB 0 CT1 36 (SUTURE) ×3 IMPLANT
SUT VIC AB 1 CT1 36 (SUTURE) ×6 IMPLANT
SUT VIC AB 2-0 CT2 27 (SUTURE) ×3 IMPLANT
SYR 20ML LL LF (SYRINGE) ×3 IMPLANT
SYR 30ML LL (SYRINGE) ×6 IMPLANT
TIBIAL BASE ROT PLAT SZ 5 KNEE (Knees) ×3 IMPLANT
TIP FAN IRRIG PULSAVAC PLUS (DISPOSABLE) ×1 IMPLANT
TOWEL OR 17X26 4PK STRL BLUE (TOWEL DISPOSABLE) ×3 IMPLANT
TOWER CARTRIDGE SMART MIX (DISPOSABLE) ×3 IMPLANT
TRAY FOLEY MTR SLVR 16FR STAT (SET/KITS/TRAYS/PACK) ×3 IMPLANT
TUBING CONNECTING 10 (TUBING) ×1 IMPLANT
TUBING CONNECTING 10' (TUBING) ×1
WRAPON POLAR PAD KNEE (MISCELLANEOUS) ×3

## 2018-11-22 NOTE — H&P (Signed)
The patient has been re-examined, and the chart reviewed, and there have been no interval changes to the documented history and physical.    The risks, benefits, and alternatives have been discussed at length. The patient expressed understanding of the risks benefits and agreed with plans for surgical intervention.  James P. Hooten, Jr. M.D.    

## 2018-11-22 NOTE — Discharge Instructions (Signed)
°  Instructions after Total Knee Replacement ° ° Taylore Hinde P. Kamera Dubas, Jr., M.D.    ° Dept. of Orthopaedics & Sports Medicine ° [facility] ° [address] ° [place], [place]  [address] ° Phone: [phone]   Fax: [phone] ° °  °DIET: °• Drink plenty of non-alcoholic fluids. °• Resume your normal diet. Include foods high in fiber. ° °ACTIVITY:  °• You may use crutches or a walker with weight-bearing as tolerated, unless instructed otherwise. °• You may be weaned off of the walker or crutches by your Physical Therapist.  °• Do NOT place pillows under the knee. Anything placed under the knee could limit your ability to straighten the knee.   °• Continue doing gentle exercises. Exercising will reduce the pain and swelling, increase motion, and prevent muscle weakness.   °• Please continue to use the TED compression stockings for 6 weeks. You may remove the stockings at night, but should reapply them in the morning. °• Do not drive or operate any equipment until instructed. ° °WOUND CARE:  °• Continue to use the PolarCare or ice packs periodically to reduce pain and swelling. °• You may bathe or shower after the staples are removed at the first office visit following surgery. ° °MEDICATIONS: °• You may resume your regular medications. °• Please take the pain medication as prescribed on the medication. °• Do not take pain medication on an empty stomach. °• You have been given a prescription for a blood thinner (Lovenox or Coumadin). Please take the medication as instructed. (NOTE: After completing a 2 week course of Lovenox, take one Enteric-coated aspirin once a day. This along with elevation will help reduce the possibility of phlebitis in your operated leg.) °• Do not drive or drink alcoholic beverages when taking pain medications. ° °CALL THE OFFICE FOR: °• Temperature above 101 degrees °• Excessive bleeding or drainage on the dressing. °• Excessive swelling, coldness, or paleness of the toes. °• Persistent  nausea and vomiting. ° °FOLLOW-UP:  °• You should have an appointment to return to the office in 10-14 days after surgery. °• Arrangements have been made for continuation of Physical Therapy (either home therapy or outpatient therapy). °  °

## 2018-11-22 NOTE — Transfer of Care (Signed)
Immediate Anesthesia Transfer of Care Note  Patient: Mackenzie White  Procedure(s) Performed: COMPUTER ASSISTED TOTAL KNEE ARTHROPLASTY - RNFA (Right Knee)  Patient Location: PACU  Anesthesia Type:Spinal  Level of Consciousness: awake and oriented  Airway & Oxygen Therapy: Patient Spontanous Breathing and Patient connected to face mask oxygen  Post-op Assessment: Report given to RN and Post -op Vital signs reviewed and stable  Post vital signs: Reviewed and stable  Last Vitals:  Vitals Value Taken Time  BP 103/64 11/22/18 1258  Temp 36.4 C 11/22/18 1229  Pulse 83 11/22/18 1310  Resp 16 11/22/18 1310  SpO2 97 % 11/22/18 1310  Vitals shown include unvalidated device data.  Last Pain:  Vitals:   11/22/18 1229  TempSrc:   PainSc: 0-No pain         Complications: No apparent anesthesia complications

## 2018-11-22 NOTE — Anesthesia Procedure Notes (Signed)
Spinal  Patient location during procedure: OR Start time: 11/22/2018 8:15 AM End time: 11/22/2018 8:19 AM Staffing Resident/CRNA: Nelda Marseille, CRNA Performed: resident/CRNA  Preanesthetic Checklist Completed: patient identified, site marked, surgical consent, pre-op evaluation, timeout performed, IV checked, risks and benefits discussed and monitors and equipment checked Spinal Block Patient position: sitting Prep: Betadine Patient monitoring: heart rate, continuous pulse ox, blood pressure and cardiac monitor Approach: midline Location: L4-5 Injection technique: single-shot Needle Needle type: Whitacre and Introducer  Needle gauge: 25 G Needle length: 9 cm Assessment Sensory level: T10 Additional Notes Negative paresthesia. Negative blood return. Positive free-flowing CSF. Expiration date of kit checked and confirmed. Patient tolerated procedure well, without complications.

## 2018-11-22 NOTE — Progress Notes (Signed)
Pt BP soft in PACU. Pt asymptomatic. Spinal during surgery. Per Dr. Andree Elk, bolus 250mg  IVF and may transport to floor when SBP >100.

## 2018-11-22 NOTE — TOC Benefit Eligibility Note (Signed)
Transition of Care (TOC) Benefit Eligibility Note    Patient Details  Name: Mackenzie White MRN: [id number] Date of Birth: 09/24/1940   Medication/Dose: Lovenox 40 mg daily x 14 days  Covered?: Yes  Tier: Other(Tier 1)  Prescription Coverage Preferred Pharmacy: Any local pharmacy  Spoke with Person/Company/Phone Number:: Jen, [employer], [phone]  Co-Pay: $10 for 30 day supply  Prior Approval: No  Deductible: Met  Additional Notes: 3 month supply only $20    Kathy A Allmond Phone Number: 11/22/2018, 3:07 PM     

## 2018-11-22 NOTE — Op Note (Signed)
OPERATIVE NOTE  DATE OF SURGERY:  11/22/2018  PATIENT NAME:  Mackenzie White   DOB: [birth year]  MRN: [id number]  PRE-OPERATIVE DIAGNOSIS: Degenerative arthrosis of the right knee, primary  POST-OPERATIVE DIAGNOSIS:  Same  PROCEDURE:  Right total knee arthroplasty using computer-assisted navigation  SURGEON:  Marciano Sequin. M.D.  ASSISTANT: Cassell Smiles, PA-C (present and scrubbed throughout the case, critical for assistance with exposure, retraction, instrumentation, and closure)  ANESTHESIA: spinal  ESTIMATED BLOOD LOSS: 50 mL  FLUIDS REPLACED: 2200 mL of crystalloid  TOURNIQUET TIME: 110 minutes  DRAINS: 2 medium Hemovac drains  SOFT TISSUE RELEASES: Anterior cruciate ligament, posterior cruciate ligament, deep medial collateral ligament, patellofemoral ligament  IMPLANTS UTILIZED: DePuy Attune size 5N posterior stabilized femoral component (cemented), size 5 rotating platform tibial component (cemented), 38 mm medialized dome patella (cemented), and a 5 mm stabilized rotating platform polyethylene insert.  INDICATIONS FOR SURGERY: Mackenzie White is a 78 y.o. year old female with a long history of progressive knee pain. X-rays demonstrated severe degenerative changes in tricompartmental fashion. The patient had not seen any significant improvement despite conservative nonsurgical intervention. After discussion of the risks and benefits of surgical intervention, the patient expressed understanding of the risks benefits and agree with plans for total knee arthroplasty.   The risks, benefits, and alternatives were discussed at length including but not limited to the risks of infection, bleeding, nerve injury, stiffness, blood clots, the need for revision surgery, cardiopulmonary complications, among others, and they were willing to proceed.  PROCEDURE IN DETAIL: The patient was brought into the operating room and, after adequate spinal anesthesia was achieved, a tourniquet was  placed on the patient's upper thigh. The patient's knee and leg were cleaned and prepped with alcohol and DuraPrep and draped in the usual sterile fashion. A "timeout" was performed as per usual protocol. The lower extremity was exsanguinated using an Esmarch, and the tourniquet was inflated to 300 mmHg. An anterior longitudinal incision was made followed by a standard mid vastus approach. The deep fibers of the medial collateral ligament were elevated in a subperiosteal fashion off of the medial flare of the tibia so as to maintain a continuous soft tissue sleeve. The patella was subluxed laterally and the patellofemoral ligament was incised. Inspection of the knee demonstrated severe degenerative changes with full-thickness loss of articular cartilage. Osteophytes were debrided using a rongeur. Anterior and posterior cruciate ligaments were excised. Two 4.0 mm Schanz pins were inserted in the femur and into the tibia for attachment of the array of trackers used for computer-assisted navigation. Hip center was identified using a circumduction technique. Distal landmarks were mapped using the computer. The distal femur and proximal tibia were mapped using the computer. The distal femoral cutting guide was positioned using computer-assisted navigation so as to achieve a 5 distal valgus cut. The femur was sized and it was felt that a size 5N femoral component was appropriate. A size 5 femoral cutting guide was positioned and the anterior cut was performed and verified using the computer. This was followed by completion of the posterior and chamfer cuts. Femoral cutting guide for the central box was then positioned in the center box cut was performed.  Attention was then directed to the proximal tibia. Medial and lateral menisci were excised. The extramedullary tibial cutting guide was positioned using computer-assisted navigation so as to achieve a 0 varus-valgus alignment and 3 posterior slope. The cut was  performed and verified using the computer. The proximal tibia was  sized and it was felt that a size 5 tibial tray was appropriate. Tibial and femoral trials were inserted followed by insertion of a 5 mm polyethylene insert. This allowed for excellent mediolateral soft tissue balancing both in flexion and in full extension. Finally, the patella was cut and prepared so as to accommodate a 38 mm medialized dome patella. A patella trial was placed and the knee was placed through a range of motion with excellent patellar tracking appreciated. The femoral trial was removed after debridement of posterior osteophytes. The central post-hole for the tibial component was reamed followed by insertion of a keel punch. Tibial trials were then removed. Cut surfaces of bone were irrigated with copious amounts of normal saline with antibiotic solution using pulsatile lavage and then suctioned dry. Polymethylmethacrylate cement was prepared in the usual fashion using a vacuum mixer. Cement was applied to the cut surface of the proximal tibia as well as along the undersurface of a size 5 rotating platform tibial component. Tibial component was positioned and impacted into place. Excess cement was removed using [employer] fast streamer. Cement was then applied to the cut surfaces of the femur as well as along the posterior flanges of the size 5N femoral component. The femoral component was positioned and impacted into place. Excess cement was removed using [employer] fast streamer. A 5 mm polyethylene trial was inserted and the knee was brought into full extension with steady axial compression applied. Finally, cement was applied to the backside of a 38 mm medialized dome patella and the patellar component was positioned and patellar clamp applied. Excess cement was removed using [employer] fast streamer. After adequate curing of the cement, the tourniquet was deflated after a total tourniquet time of 110 minutes. Hemostasis was achieved using electrocautery.  The knee was irrigated with copious amounts of normal saline with antibiotic solution using pulsatile lavage and then suctioned dry. 20 mL of 1.3% Exparel and 60 mL of 0.25% Marcaine in 40 mL of normal saline was injected along the posterior capsule, medial and lateral gutters, and along the arthrotomy site. A 5 mm stabilized rotating platform polyethylene insert was inserted and the knee was placed through a range of motion with excellent mediolateral soft tissue balancing appreciated and excellent patellar tracking noted. 2 medium drains were placed in the wound bed and brought out through separate stab incisions. The medial parapatellar portion of the incision was reapproximated using interrupted sutures of #1 Vicryl. Subcutaneous tissue was approximated in layers using first #0 Vicryl followed #2-0 Vicryl. The skin was approximated with skin staples. A sterile dressing was applied.  The patient tolerated the procedure well and was transported to the recovery room in stable condition.    James P. Holley Bouche., M.D.

## 2018-11-22 NOTE — Anesthesia Preprocedure Evaluation (Signed)
Anesthesia Evaluation  Patient identified by MRN, date of birth, ID band Patient awake    Reviewed: Allergy & Precautions, H&P , NPO status , Patient's Chart, lab work & pertinent test results, reviewed documented beta blocker date and time   Airway Mallampati: II   Neck ROM: full    Dental  (+) Poor Dentition   Pulmonary neg pulmonary ROS,    Pulmonary exam normal        Cardiovascular Exercise Tolerance: Good hypertension, On Medications negative cardio ROS Normal cardiovascular exam Rhythm:regular Rate:Normal     Neuro/Psych PSYCHIATRIC DISORDERS Depression  Neuromuscular disease    GI/Hepatic Neg liver ROS, hiatal hernia, GERD  Medicated,  Endo/Other  negative endocrine ROS  Renal/GU negative Renal ROS  negative genitourinary   Musculoskeletal   Abdominal   Peds  Hematology negative hematology ROS (+)   Anesthesia Other Findings Past Medical History: No date: Allergy No date: Arthritis No date: Cancer [facility])     Comment:  basal cell skin cancer No date: Carpal tunnel syndrome of right wrist No date: Cataract     Comment:  bilateral eyes in 2015 No date: Depression No date: Diverticulosis No date: Gallstones No date: GERD (gastroesophageal reflux disease) No date: History of gluten intolerance No date: History of hiatal hernia     Comment:  big per pt No date: Hyperlipemia No date: Hypertension Past Surgical History: No date: ABDOMINAL HYSTERECTOMY No date: ACHILLES TENDON SURGERY; Right No date: APPENDECTOMY 07/23/2011: CARPAL TUNNEL RELEASE     Comment:  Procedure: CARPAL TUNNEL RELEASE;  Surgeon: Cammie Sickle., MD;  Location: [place];                Service: Orthopedics;  Laterality: Right; No date: CHOLECYSTECTOMY No date: COLONOSCOPY WITH ESOPHAGOGASTRODUODENOSCOPY (EGD) 07/03/2018: KNEE ARTHROPLASTY; Left     Comment:  Procedure: COMPUTER ASSISTED TOTAL  KNEE ARTHROPLASTY -               LEFT;  Surgeon: Dereck Leep, MD;  Location: [facility];              Service: Orthopedics;  Laterality: Left; No date: TUBAL LIGATION BMI    Body Mass Index: 28.04 kg/m     Reproductive/Obstetrics negative OB ROS                             Anesthesia Physical Anesthesia Plan  ASA: III  Anesthesia Plan: Spinal   Post-op Pain Management:    Induction:   PONV Risk Score and Plan:   Airway Management Planned:   Additional Equipment:   Intra-op Plan:   Post-operative Plan:   Informed Consent: I have reviewed the patients History and Physical, chart, labs and discussed the procedure including the risks, benefits and alternatives for the proposed anesthesia with the patient or authorized representative who has indicated his/her understanding and acceptance.     Dental Advisory Given  Plan Discussed with: CRNA  Anesthesia Plan Comments:         Anesthesia Quick Evaluation

## 2018-11-22 NOTE — TOC Progression Note (Signed)
Transition of Care [facility]) - Progression Note    Patient Details  Name: Mackenzie White MRN: [id number] Date of Birth: 06-07-1940  [facility] of [facility]) CM/SW Contact  Su Hilt, RN Phone Number: 11/22/2018, 11:49 AM  Clinical Narrative:     Requested the price of Lovenox       Expected Discharge Plan and Services                                                 Social Determinants of Health (SDOH) Interventions    Readmission Risk Interventions No flowsheet data found.

## 2018-11-22 NOTE — Evaluation (Signed)
Physical Therapy Evaluation Patient Details Name: Mackenzie White MRN: [id number] DOB: 1940-07-08 Today's Date: 11/22/2018   History of Present Illness  Pt admitted for R TKR. Pt with history of L TKR (May 2020), HTN, GERD, and depression  Clinical Impression  Pt is a pleasant 78 year old female who was admitted for R TKR. Pt performs bed mobility, transfers, and ambulation with cga and RW. Pt is motivated to perform therapy. Pt demonstrates deficits with strength/mobility/pain. Educated on Ivan status. Pt demonstrates ability to perform 10 SLRs with independence, therefore does not require KI for mobility. Would benefit from skilled PT to address above deficits and promote optimal return to PLOF. Recommend transition to [place] upon discharge from acute hospitalization.     Follow Up Recommendations Home health PT    Equipment Recommendations  None recommended by PT    Recommendations for Other Services       Precautions / Restrictions Precautions Precautions: Knee;Fall Precaution Booklet Issued: No Restrictions Weight Bearing Restrictions: Yes RLE Weight Bearing: Weight bearing as tolerated      Mobility  Bed Mobility Overal bed mobility: Needs Assistance Bed Mobility: Supine to Sit     Supine to sit: Min guard     General bed mobility comments: safe technique with minimal cues for sequencing.  Transfers Overall transfer level: Needs assistance Equipment used: Rolling walker (2 wheeled) Transfers: Sit to/from Stand Sit to Stand: Min guard         General transfer comment: safe technique. Stands in place for a few minutes to prevent dizziness symptoms. Upright posture, good WBing noted on R LE  Ambulation/Gait Ambulation/Gait assistance: Min guard Gait Distance (Feet): 5 Feet Assistive device: Rolling walker (2 wheeled) Gait Pattern/deviations: Step-to pattern     General Gait Details: ambulated using RW and safe technique. Short step to gait pattern  noted  Stairs            Wheelchair Mobility    Modified Rankin (Stroke Patients Only)       Balance Overall balance assessment: Needs assistance Sitting-balance support: Feet supported Sitting balance-Leahy Scale: Good     Standing balance support: Bilateral upper extremity supported Standing balance-Leahy Scale: Good                               Pertinent Vitals/Pain Pain Assessment: No/denies pain    Home Living Family/patient expects to be discharged to:: Private residence Living Arrangements: Alone Available Help at Discharge: Family(will have daughter staying with her) Type of Home: House Home Access: Ramped entrance     Home Layout: One level Home Equipment: Environmental consultant - 2 wheels;Cane - single point      Prior Function Level of Independence: Independent         Comments: recovered well from previous TKR.     Hand Dominance        Extremity/Trunk Assessment   Upper Extremity Assessment Upper Extremity Assessment: Overall WFL for tasks assessed    Lower Extremity Assessment Lower Extremity Assessment: Generalized weakness(R LE grossly 3/5)       Communication   Communication: No difficulties  Cognition Arousal/Alertness: Awake/alert Behavior During Therapy: WFL for tasks assessed/performed Overall Cognitive Status: Within Functional Limits for tasks assessed  General Comments      Exercises Total Joint Exercises Goniometric ROM: R knee AAROM: 5-70 degrees Other Exercises Other Exercises: supine ther-ex performed on R LE including AP, SLRs, hip abd/add, and seated knee flexion. 10 reps with supervision   Assessment/Plan    PT Assessment Patient needs continued PT services  PT Problem List Decreased strength;Decreased balance;Decreased mobility;Pain       PT Treatment Interventions DME instruction;Gait training;Stair training;Balance training;Therapeutic exercise     PT Goals (Current goals can be found in the Care Plan section)  Acute Rehab PT Goals Patient Stated Goal: to go home PT Goal Formulation: With patient Time For Goal Achievement: 12/06/18 Potential to Achieve Goals: Good    Frequency BID   Barriers to discharge        Co-evaluation               AM-PAC PT "6 Clicks" Mobility  Outcome Measure Help needed turning from your back to your side while in a flat bed without using bedrails?: None Help needed moving from lying on your back to sitting on the side of a flat bed without using bedrails?: None Help needed moving to and from a bed to a chair (including a wheelchair)?: A Little Help needed standing up from a chair using your arms (e.g., wheelchair or bedside chair)?: A Little Help needed to walk in hospital room?: A Little Help needed climbing 3-5 steps with a railing? : A Little 6 Click Score: 20    End of Session Equipment Utilized During Treatment: Gait belt Activity Tolerance: Patient tolerated treatment well Patient left: in chair;with chair alarm set;with SCD's reapplied Nurse Communication: Mobility status PT Visit Diagnosis: Muscle weakness (generalized) (M62.81);Difficulty in walking, not elsewhere classified (R26.2);Pain Pain - Right/Left: Right Pain - part of body: Knee    Time: [id number] PT Time Calculation (min) (ACUTE ONLY): 24 min   Charges:   PT Evaluation $PT Eval Low Complexity: 1 Low PT Treatments $Therapeutic Exercise: 8-22 mins        Greggory Stallion, PT, DPT [phone]   Mackenzie White 11/22/2018, 4:22 PM

## 2018-11-22 NOTE — Anesthesia Post-op Follow-up Note (Signed)
Anesthesia QCDR form completed.        

## 2018-11-23 NOTE — Progress Notes (Signed)
  Subjective: 1 Day Post-Op Procedure(s) (LRB): COMPUTER ASSISTED TOTAL KNEE ARTHROPLASTY - RNFA (Right) Patient reports pain as well controlled.   Patient is well, and has had no acute complaints or problems Plan is to go Home after hospital stay. Negative for chest pain and shortness of breath Fever: no Gastrointestinal:Negative for nausea and vomiting. Patient reports no bowel movement.  Objective: Vital signs in last 24 hours: Temp:  [97.6 F (36.4 C)-98.2 F (36.8 C)] 98.2 F (36.8 C) (10/08 0416) Pulse Rate:  [67-91] 69 (10/08 0416) Resp:  [7-19] 19 (10/08 0416) BP: (90-109)/(48-64) 109/57 (10/08 0416) SpO2:  [93 %-99 %] 95 % (10/08 0416)  Intake/Output from previous day:  Intake/Output Summary (Last 24 hours) at 11/23/2018 0741 Last data filed at 11/23/2018 [id number] Gross per 24 hour  Intake 4212.73 ml  Output 1560 ml  Net 2652.73 ml    Intake/Output this shift: No intake/output data recorded.  Labs: No results for input(s): HGB in the last 72 hours. No results for input(s): WBC, RBC, HCT, PLT in the last 72 hours. No results for input(s): NA, K, CL, CO2, BUN, CREATININE, GLUCOSE, CALCIUM in the last 72 hours. No results for input(s): LABPT, INR in the last 72 hours.   EXAM General - Patient is Alert, Appropriate and Oriented Extremity - Neurovascular intact Dorsiflexion/Plantar flexion intact Compartment soft Dressing/Incision - Polar care on and in place Motor Function - intact, moving foot and toes well on exam. Able to perform straight leg raise  Cardiovascular- Regular rate and rhythm, no murmurs/rubs/gallops Respiratory- Lungs clear to auscultation bilaterally Gastrointestinal- active bowel sounds   Assessment/Plan: 1 Day Post-Op Procedure(s) (LRB): COMPUTER ASSISTED TOTAL KNEE ARTHROPLASTY - RNFA (Right) Active Problems:   Total knee replacement status  Estimated body mass index is 28.04 kg/m as calculated from the following:   Height as of this  encounter: 5' 6.5" (1.689 m).   Weight as of this encounter: 80 kg. Advance diet Up with therapy Plan for discharge tomorrow  DVT Prophylaxis - Lovenox, foot pumps, TED hose Weight-Bearing as tolerated to right leg  Cassell Smiles, PA-C [facility] Orthopaedic Surgery 11/23/2018, 7:41 AM

## 2018-11-23 NOTE — Progress Notes (Signed)
Physical Therapy Treatment Patient Details Name: Mackenzie White MRN: [id number] DOB: 1940/04/04 Today's Date: 11/23/2018    History of Present Illness Pt admitted for R TKR. Pt with history of L TKR (May 2020), HTN, GERD, and depression    PT Comments    Pt is making good progress towards goals with ability to improve gait symmetry during ambulation attempts. Pt continues to be very motivated to participate. Reports pain at 5/10 in post LE. Good endurance with there-ex and ROM. Will continue to progress as able.   Follow Up Recommendations  Home health PT     Equipment Recommendations  None recommended by PT    Recommendations for Other Services       Precautions / Restrictions Precautions Precautions: Knee;Fall Precaution Booklet Issued: Yes (comment) Restrictions Weight Bearing Restrictions: Yes RLE Weight Bearing: Weight bearing as tolerated    Mobility  Bed Mobility Overal bed mobility: Needs Assistance Bed Mobility: Supine to Sit     Supine to sit: Supervision Sit to supine: Supervision   General bed mobility comments: slightly impulsive, needs cues for safety.  Transfers Overall transfer level: Needs assistance Equipment used: Rolling walker (2 wheeled) Transfers: Sit to/from Stand Sit to Stand: Min guard         General transfer comment: safe technique with upright posture. RW used   Ambulation/Gait Ambulation/Gait assistance: Counsellor (Feet): 110 Feet Assistive device: Rolling walker (2 wheeled) Gait Pattern/deviations: Step-through pattern     General Gait Details: ambulated using RW with improved reciprocal gait pattern. Improved gait symmetry.   Stairs             Wheelchair Mobility    Modified Rankin (Stroke Patients Only)       Balance Overall balance assessment: Needs assistance Sitting-balance support: Feet supported Sitting balance-Leahy Scale: Good     Standing balance support: Bilateral upper  extremity supported Standing balance-Leahy Scale: Good                              Cognition Arousal/Alertness: Awake/alert Behavior During Therapy: WFL for tasks assessed/performed Overall Cognitive Status: Within Functional Limits for tasks assessed                                        Exercises Total Joint Exercises Goniometric ROM: R knee AAROM: 1-81 degrees Other Exercises Other Exercises: seated ther-ex performed on R LE including AP, quad sets, SLRs, LAQ, and knee flexion. 12 reps with supervision and HEP reviewed. Other Exercises: Pt instructed in polar care mgt, compression stocking mgt, AE/DME, and falls prevention; handout provided, pt verbalized understanding and plan for return home with all necessary equipment already in place Other Exercises: Pt ambulated to bathroom with safe technique, needed supervision for set up. Able to perform self hygiene safely.    General Comments        Pertinent Vitals/Pain Pain Assessment: 0-10 Pain Score: 5  Pain Location: R knee Pain Descriptors / Indicators: Aching Pain Intervention(s): Limited activity within patient's tolerance;Premedicated before session;Ice applied    Home Living Family/patient expects to be discharged to:: Private residence Living Arrangements: Alone Available Help at Discharge: Family(will have daughter staying with her) Type of Home: House Home Access: [place]: One level Home Equipment: Environmental consultant - 2 wheels;Cane - single point;Grab bars - toilet;Adaptive equipment;Shower seat -  built in      Prior Function Level of Independence: Independent      Comments: recovered well from previous TKR.   PT Goals (current goals can now be found in the care plan section) Acute Rehab PT Goals Patient Stated Goal: to go home PT Goal Formulation: With patient Time For Goal Achievement: 12/06/18 Potential to Achieve Goals: Good Progress towards PT goals:  Progressing toward goals    Frequency    BID      PT Plan Current plan remains appropriate    Co-evaluation              AM-PAC PT "6 Clicks" Mobility   Outcome Measure  Help needed turning from your back to your side while in a flat bed without using bedrails?: None Help needed moving from lying on your back to sitting on the side of a flat bed without using bedrails?: None Help needed moving to and from a bed to a chair (including a wheelchair)?: A Little Help needed standing up from a chair using your arms (e.g., wheelchair or bedside chair)?: A Little Help needed to walk in hospital room?: A Little Help needed climbing 3-5 steps with a railing? : A Little 6 Click Score: 20    End of Session Equipment Utilized During Treatment: Gait belt Activity Tolerance: Patient tolerated treatment well Patient left: in chair;with chair alarm set;with SCD's reapplied Nurse Communication: Mobility status PT Visit Diagnosis: Muscle weakness (generalized) (M62.81);Difficulty in walking, not elsewhere classified (R26.2);Pain Pain - Right/Left: Right Pain - part of body: Knee     Time: [id number] PT Time Calculation (min) (ACUTE ONLY): 23 min  Charges:  $Gait Training: 8-22 mins $Therapeutic Exercise: 8-22 mins                     Greggory Stallion, PT, DPT [phone]    Eyob Godlewski 11/23/2018, 12:59 PM

## 2018-11-23 NOTE — Evaluation (Signed)
Occupational Therapy Evaluation Patient Details Name: Mackenzie White MRN: [id number] DOB: 1940/09/21 Today's Date: 11/23/2018    History of Present Illness Pt admitted for R TKR. Pt with history of L TKR (May 2020), HTN, GERD, and depression   Clinical Impression   Pt seen for OT evaluation this date, POD#1 from above surgery. Pt was independent in all ADLs prior to surgery and reports recovering well from previous L TKR in May. Pt is eager to return to PLOF with less pain and improved safety and independence. Pt currently requires minimal assist for LB dressing while in seated position due to pain and limited AROM of R knee. Pt instructed in polar care mgt, falls prevention strategies, home/routines modifications, DME/AE for LB bathing and dressing tasks, and compression stocking mgt. Handout provided; pt verbalized understanding and plan for return home with all necessary equipment already in place. Daughter plans to stay with pt for as long as needed. Do not currently anticipate any additional skilled OT needs at this time. Will sign off. Please re-consult if additional needs arise.     Follow Up Recommendations  No OT follow up    Equipment Recommendations  None recommended by OT    Recommendations for Other Services       Precautions / Restrictions Precautions Precautions: Knee;Fall Precaution Booklet Issued: No Restrictions Weight Bearing Restrictions: Yes RLE Weight Bearing: Weight bearing as tolerated      Mobility Bed Mobility Overal bed mobility: Needs Assistance Bed Mobility: Supine to Sit;Sit to Supine     Supine to sit: Supervision Sit to supine: Supervision      Transfers Overall transfer level: Needs assistance Equipment used: Rolling walker (2 wheeled) Transfers: Sit to/from Stand Sit to Stand: Min guard              Balance Overall balance assessment: Needs assistance Sitting-balance support: Feet supported Sitting balance-Leahy Scale: Good      Standing balance support: Bilateral upper extremity supported Standing balance-Leahy Scale: Good                             ADL either performed or assessed with clinical judgement   ADL                                         General ADL Comments: Min A for LB ADL, CGA for functional mobility; family able to provide needed level of assist     Vision Baseline Vision/History: Wears glasses Wears Glasses: Reading only Patient Visual Report: No change from baseline Vision Assessment?: No apparent visual deficits     Perception     Praxis      Pertinent Vitals/Pain Pain Assessment: 0-10 Pain Score: 4  Pain Location: R knee Pain Descriptors / Indicators: Aching Pain Intervention(s): Limited activity within patient's tolerance;Monitored during session;Ice applied     Hand Dominance Right   Extremity/Trunk Assessment Upper Extremity Assessment Upper Extremity Assessment: Overall WFL for tasks assessed   Lower Extremity Assessment Lower Extremity Assessment: RLE deficits/detail RLE Deficits / Details: R LE grossly 3/5       Communication Communication Communication: No difficulties   Cognition Arousal/Alertness: Awake/alert Behavior During Therapy: WFL for tasks assessed/performed Overall Cognitive Status: Within Functional Limits for tasks assessed  General Comments       Exercises Other Exercises Other Exercises: Pt instructed in polar care mgt, compression stocking mgt, AE/DME, and falls prevention; handout provided, pt verbalized understanding and plan for return home with all necessary equipment already in place   Shoulder Instructions      Home Living Family/patient expects to be discharged to:: Private residence Living Arrangements: Alone Available Help at Discharge: Family(will have daughter staying with her) Type of Home: House Home Access: [place]: One level     Bathroom Shower/Tub: Occupational psychologist: Handicapped height     Home Equipment: Environmental consultant - 2 wheels;Cane - single point;Grab bars - toilet;Adaptive equipment;Shower seat - built in [employer]: [place]        Prior Functioning/Environment Level of Independence: Independent        Comments: recovered well from previous TKR.        OT Problem List: Pain;Decreased strength;Decreased range of motion      OT Treatment/Interventions:      OT Goals(Current goals can be found in the care plan section) Acute Rehab OT Goals Patient Stated Goal: to go home OT Goal Formulation: All assessment and education complete, DC therapy  OT Frequency:     Barriers to D/C:            Co-evaluation              AM-PAC OT "6 Clicks" Daily Activity     Outcome Measure Help from another person eating meals?: None Help from another person taking care of personal grooming?: None Help from another person toileting, which includes using toliet, bedpan, or urinal?: A Little Help from another person bathing (including washing, rinsing, drying)?: A Little Help from another person to put on and taking off regular upper body clothing?: None Help from another person to put on and taking off regular lower body clothing?: A Little 6 Click Score: 21   End of Session    Activity Tolerance: Patient tolerated treatment well Patient left: in bed;with call bell/phone within reach;with bed alarm set;with SCD's reapplied;Other (comment)(polar care in place)  OT Visit Diagnosis: Other abnormalities of gait and mobility (R26.89);Pain Pain - Right/Left: Right Pain - part of body: Knee                Time: [id number] OT Time Calculation (min): 10 min Charges:  OT General Charges $OT Visit: 1 Visit OT Evaluation $OT Eval Low Complexity: 1 Low  Jeni Salles, MPH, MS, OTR/L ascom [phone] 11/23/18, 9:21 AM

## 2018-11-23 NOTE — Anesthesia Postprocedure Evaluation (Signed)
Anesthesia Post Note  Patient: Mackenzie White  Procedure(s) Performed: COMPUTER ASSISTED TOTAL KNEE ARTHROPLASTY - RNFA (Right Knee)  Patient location during evaluation: Nursing Unit Anesthesia Type: Spinal Level of consciousness: oriented and awake and alert Pain management: pain level controlled Vital Signs Assessment: post-procedure vital signs reviewed and stable Respiratory status: spontaneous breathing and respiratory function stable Cardiovascular status: blood pressure returned to baseline and stable Postop Assessment: no headache, no backache, no apparent nausea or vomiting and patient able to bend at knees Anesthetic complications: no     Last Vitals:  Vitals:   11/22/18 2344 11/23/18 0416  BP: (!) 102/52 (!) 109/57  Pulse: 72 69  Resp: 19 19  Temp: 36.8 C 36.8 C  SpO2: 97% 95%    Last Pain:  Vitals:   11/23/18 0616  TempSrc:   PainSc: 3                  Caryl Asp

## 2018-11-23 NOTE — Progress Notes (Signed)
Physical Therapy Treatment Patient Details Name: Mackenzie White MRN: [id number] DOB: 06-05-1940 Today's Date: 11/23/2018    History of Present Illness Pt admitted for R TKR. Pt with history of L TKR (May 2020), HTN, GERD, and depression    PT Comments    Pt is making good progress towards goals with improved ambulation distance this date. Good endurance with there-ex. Pt reports feeling slightly shaky, however doesn't appear to limit functional mobility at this time. Pt continues to be motivated to participate in therapy. Will continue to progress as able.    Follow Up Recommendations  Home health PT     Equipment Recommendations  None recommended by PT    Recommendations for Other Services       Precautions / Restrictions Precautions Precautions: Knee;Fall Precaution Booklet Issued: Yes (comment) Restrictions Weight Bearing Restrictions: Yes RLE Weight Bearing: Weight bearing as tolerated    Mobility  Bed Mobility Overal bed mobility: Needs Assistance Bed Mobility: Supine to Sit     Supine to sit: Supervision Sit to supine: Supervision   General bed mobility comments: safe technique with ability to hook L foot under R foot for improved independence  Transfers Overall transfer level: Needs assistance Equipment used: Rolling walker (2 wheeled) Transfers: Sit to/from Stand Sit to Stand: Min guard         General transfer comment: safe technique with upright posture. RW used   Ambulation/Gait Ambulation/Gait assistance: Counsellor (Feet): 220 Feet Assistive device: Rolling walker (2 wheeled) Gait Pattern/deviations: Step-through pattern     General Gait Details: ambulated using RW and reciprocal gait pattern. Slightly shaky this PM, doesn't appear to limit functional mobility.   Stairs             Wheelchair Mobility    Modified Rankin (Stroke Patients Only)       Balance Overall balance assessment: Needs  assistance Sitting-balance support: Feet supported Sitting balance-Leahy Scale: Good     Standing balance support: Bilateral upper extremity supported Standing balance-Leahy Scale: Good                              Cognition Arousal/Alertness: Awake/alert Behavior During Therapy: WFL for tasks assessed/performed Overall Cognitive Status: Within Functional Limits for tasks assessed                                        Exercises Total Joint Exercises Goniometric ROM: R knee AAROM: 1-81 degrees Other Exercises Other Exercises: supine ther-ex performed on R LE including AP, quad sets, hip abd/add, SLRs, and LAQ. 12 reps with supervision and HEP reviewed. Other Exercises: Pt ambulated to bathroom with safe technique, needed supervision for set up. Able to perform self hygiene safely.    General Comments        Pertinent Vitals/Pain Pain Assessment: 0-10 Pain Score: 5  Pain Location: R knee Pain Descriptors / Indicators: Aching Pain Intervention(s): Limited activity within patient's tolerance;Repositioned;Ice applied    Home Living                      Prior Function            PT Goals (current goals can now be found in the care plan section) Acute Rehab PT Goals Patient Stated Goal: to go home PT Goal Formulation: With patient Time For Goal  Achievement: 12/06/18 Potential to Achieve Goals: Good Progress towards PT goals: Progressing toward goals    Frequency    BID      PT Plan Current plan remains appropriate    Co-evaluation              AM-PAC PT "6 Clicks" Mobility   Outcome Measure  Help needed turning from your back to your side while in a flat bed without using bedrails?: None Help needed moving from lying on your back to sitting on the side of a flat bed without using bedrails?: None Help needed moving to and from a bed to a chair (including a wheelchair)?: A Little Help needed standing up from a chair  using your arms (e.g., wheelchair or bedside chair)?: A Little Help needed to walk in hospital room?: A Little Help needed climbing 3-5 steps with a railing? : A Little 6 Click Score: 20    End of Session Equipment Utilized During Treatment: Gait belt Activity Tolerance: Patient tolerated treatment well Patient left: in bed;with bed alarm set;with SCD's reapplied Nurse Communication: Mobility status PT Visit Diagnosis: Muscle weakness (generalized) (M62.81);Difficulty in walking, not elsewhere classified (R26.2);Pain Pain - Right/Left: Right Pain - part of body: Knee     Time: 1435-1459 PT Time Calculation (min) (ACUTE ONLY): 24 min  Charges:  $Gait Training: 8-22 mins $Therapeutic Exercise: 8-22 mins                     Greggory Stallion, PT, DPT [phone]    Mackenzie White 11/23/2018, 4:29 PM

## 2018-11-23 NOTE — TOC Initial Note (Signed)
Transition of Care [facility]) - Initial/Assessment Note    Patient Details  Name: Mackenzie White MRN: [id number] Date of Birth: 22-Jul-1940  Transition of Care [facility]) CM/SW Contact:    Su Hilt, RN Phone Number: 11/23/2018, 8:53 AM  Clinical Narrative:                 Spoke with the patient this morning and discussed DC plan and needs She lives home alone but her daughter will be staying with her Her daughter will provide transportation She can afford her medications she was notified of the price of Lovenox of 10$ She stated that was fine She has a RW and a cane at home and does not need additional DME She is set up with [facility] for Home health No additional needs  Expected Discharge Plan: [place] Barriers to Discharge: Continued Medical Work up   Patient Goals and CMS Choice Patient states their goals for this hospitalization and ongoing recovery are:: go home      Expected Discharge Plan and Services Expected Discharge Plan: [place]   Discharge Planning Services: CM Consult   Living arrangements for the past 2 months: Single Family Home                 DME Arranged: N/A         HH Arranged: PT HH Agency: [facility] (formerly [employer]) Date [place]: 11/23/18 Time [place]: [phone] Representative spoke with at [place]: Helene Kelp  Prior Living Arrangements/Services Living arrangements for the past 2 months: [place] with:: Self(Daughter will be staying with her) Patient language and need for interpreter reviewed:: Yes Do you feel safe going back to the place where you live?: Yes      Need for Family Participation in Patient Care: No (Comment) Care giver support system in place?: Yes (comment) Current home services: DME(RW and cane) Criminal Activity/Legal Involvement Pertinent to Current Situation/Hospitalization: No - Comment as needed  Activities of Daily  Living Home Assistive Devices/Equipment: Environmental consultant (specify type), Cane (specify quad or straight), Wheelchair, Built-in shower seat ADL Screening (condition at time of admission) Patient's cognitive ability adequate to safely complete daily activities?: Yes Is the patient deaf or have difficulty hearing?: No Does the patient have difficulty seeing, even when wearing glasses/contacts?: No Does the patient have difficulty concentrating, remembering, or making decisions?: No Patient able to express need for assistance with ADLs?: Yes Does the patient have difficulty dressing or bathing?: No Independently performs ADLs?: Yes (appropriate for developmental age) Does the patient have difficulty walking or climbing stairs?: Yes Weakness of Legs: Right Weakness of Arms/Hands: None  Permission Sought/Granted   Permission granted to share information with : Yes, Verbal Permission Granted              Emotional Assessment Appearance:: Appears stated age Attitude/Demeanor/Rapport: Engaged Affect (typically observed): Accepting, Appropriate Orientation: : Oriented to Self, Oriented to Place, Oriented to  Time, Oriented to Situation Alcohol / Substance Use: Not Applicable Psych Involvement: No (comment)  Admission diagnosis:  PRIMARY OSTEOARTHRITIS OF RIGHT KNEE. Patient Active Problem List   Diagnosis Date Noted  . 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 Depression 07/03/2018  . GERD (gastroesophageal reflux disease) 07/03/2018  . Hypertension 07/03/2018  . Status post total left knee replacement 07/03/2018  . Primary osteoarthritis of both knees 04/23/2018  . Primary osteoarthritis of right knee 04/23/2018   PCP:  [place],  Fransico Him, MD Pharmacy:   [id number] - [place], [address] [place] [address] Phone: [phone] Fax: [phone]     Social Determinants of Health (SDOH) Interventions    Readmission Risk Interventions No  flowsheet data found.

## 2018-11-24 MED ORDER — ENOXAPARIN SODIUM 40 MG/0.4ML ~~LOC~~ SOLN: 40.0000 mg | SUBCUTANEOUS | 0 refills | Status: DC

## 2018-11-24 MED ORDER — CELECOXIB 200 MG PO CAPS: 200.0000 mg | ORAL_CAPSULE | Freq: Two times a day (BID) | ORAL | 0 refills | Status: AC

## 2018-11-24 MED ORDER — OXYCODONE HCL 5 MG PO TABS: 5.0000 mg | ORAL_TABLET | Freq: Four times a day (QID) | ORAL | 0 refills | Status: DC | PRN

## 2018-11-24 MED ORDER — TRAMADOL HCL 50 MG PO TABS: 50.0000 mg | ORAL_TABLET | ORAL | 1 refills | Status: DC | PRN

## 2018-11-24 NOTE — Progress Notes (Addendum)
D: Pt alert and oriented. Pt denies experiencing any pain at this time.   A: Pt received discharge and medication education/information.  R: Pt verbalized understanding of discharge and medication education/information.  Pt escorted in wheelchair by staff to front lobby with belongings (two dressing changes, bone foam, discharge instructions, and polar care) where family picked pt up.  Final VS were taken, IV removed, dressing (honey comb) changed this morning by PA. Dressing was assessed prior to discharge.

## 2018-11-24 NOTE — Progress Notes (Addendum)
  Subjective: 2 Days Post-Op Procedure(s) (LRB): COMPUTER ASSISTED TOTAL KNEE ARTHROPLASTY - RNFA (Right) Patient reports pain as Well-controlled.   Patient is well, and has had no acute complaints or problems Plan is to go Home after hospital stay. Negative for chest pain and shortness of breath Fever: no Gastrointestinal: Negative for nausea and vomiting  Objective: Vital signs in last 24 hours: Temp:  [97.6 F (36.4 C)-97.7 F (36.5 C)] 97.6 F (36.4 C) (10/09 0019) Pulse Rate:  [75-76] 76 (10/09 0019) Resp:  [17] 17 (10/09 0019) BP: (115-132)/(59-68) 132/68 (10/09 0019) SpO2:  [97 %-99 %] 97 % (10/09 0019)  Intake/Output from previous day:  Intake/Output Summary (Last 24 hours) at 11/24/2018 0800 Last data filed at 11/24/2018 0220 Gross per 24 hour  Intake 947.85 ml  Output 150 ml  Net 797.85 ml    Intake/Output this shift: No intake/output data recorded.  Labs: No results for input(s): HGB in the last 72 hours. No results for input(s): WBC, RBC, HCT, PLT in the last 72 hours. No results for input(s): NA, K, CL, CO2, BUN, CREATININE, GLUCOSE, CALCIUM in the last 72 hours. No results for input(s): LABPT, INR in the last 72 hours.   EXAM General - Patient is Alert, Appropriate and Oriented Extremity - Neurovascular intact Incision: no drainage Compartment soft Dressing/Incision - clean, no drainage Motor Function - intact, moving foot and toes well on exam.  Able to perform straight leg raise. Cardiovascular- Regular rate and rhythm, no murmurs/rubs/gallops Respiratory- Lungs clear to auscultation bilaterally Gastrointestinal- soft, nontender   Assessment/Plan: 2 Days Post-Op Procedure(s) (LRB): COMPUTER ASSISTED TOTAL KNEE ARTHROPLASTY - RNFA (Right) Active Problems:   Total knee replacement status  Estimated body mass index is 28.04 kg/m as calculated from the following:   Height as of this encounter: 5' 6.5" (1.689 m).   Weight as of this encounter: 80  kg. Advance diet Up with therapy Discharge home with home health  DVT Prophylaxis - Lovenox, TED hose, foot pumps Weight-Bearing as tolerated to right leg  Patient's drain was removed.  Patient's bandage was changed.  Prior to completing discharge summary, patient's controlled substance use was verified with the [facility] controlled substances registry.   Cassell Smiles, PA-C [facility] 11/24/2018, 8:00 AM

## 2018-11-24 NOTE — Progress Notes (Signed)
Physical Therapy Treatment Patient Details Name: Mackenzie White MRN: [id number] DOB: 09-27-1940 Today's Date: 11/24/2018    History of Present Illness Pt admitted for R TKR. Pt with history of L TKR (May 2020), HTN, GERD, and depression    PT Comments    Pt is making good progress towards goals and is ready to dc this date. Pt does well during ambulation with reciprocal gait pattern and symmetrical steps. Still reports feeling shaky, doesn't limit functional mobility. Good endurance with there-ex and AAROM. Reviewed HEP. Performed stair training this AM. Pt has ramped entrance. RN notified pt has met all PT goals at this time.  Follow Up Recommendations  Home health PT     Equipment Recommendations  None recommended by PT    Recommendations for Other Services       Precautions / Restrictions Precautions Precautions: Knee;Fall Precaution Booklet Issued: Yes (comment) Restrictions Weight Bearing Restrictions: Yes RLE Weight Bearing: Weight bearing as tolerated    Mobility  Bed Mobility Overal bed mobility: Needs Assistance Bed Mobility: Sit to Supine       Sit to supine: Min guard   General bed mobility comments: needed assist to guide R LE back into bed.   Transfers Overall transfer level: Needs assistance Equipment used: Rolling walker (2 wheeled) Transfers: Sit to/from Stand Sit to Stand: Min guard         General transfer comment: safe technique with upright posture. RW used   Ambulation/Gait Ambulation/Gait assistance: Counsellor (Feet): 250 Feet Assistive device: Rolling walker (2 wheeled) Gait Pattern/deviations: Step-through pattern     General Gait Details: ambulated using RW demonstrating reciprocal gait pattern. Still reports slight shakiness this AM. Vitals remain WNL. Doesn't appear to limit funcitonal mobility.   Stairs Stairs: Yes Stairs assistance: Min guard Stair Management: Two rails Number of Stairs: 4 General stair  comments: ambulated up/down stairs with B rails and step to gait pattern. Safe technique.    Wheelchair Mobility    Modified Rankin (Stroke Patients Only)       Balance Overall balance assessment: Needs assistance Sitting-balance support: Feet supported Sitting balance-Leahy Scale: Good     Standing balance support: Bilateral upper extremity supported Standing balance-Leahy Scale: Good                              Cognition Arousal/Alertness: Awake/alert Behavior During Therapy: WFL for tasks assessed/performed Overall Cognitive Status: Within Functional Limits for tasks assessed                                        Exercises Total Joint Exercises Goniometric ROM: R knee AAROM: 0-92 degrees Other Exercises Other Exercises: supine ther-ex performed on R LE including AP, quad sets, hip abd/add, SLRs, seated knee flexion and LAQ. 15 reps with supervision and HEP reviewed.    General Comments        Pertinent Vitals/Pain Pain Assessment: No/denies pain    Home Living                      Prior Function            PT Goals (current goals can now be found in the care plan section) Acute Rehab PT Goals Patient Stated Goal: to go home PT Goal Formulation: With patient Time For Goal Achievement: 12/06/18 Potential  to Achieve Goals: Good Progress towards PT goals: Progressing toward goals    Frequency    BID      PT Plan Current plan remains appropriate    Co-evaluation              AM-PAC PT "6 Clicks" Mobility   Outcome Measure  Help needed turning from your back to your side while in a flat bed without using bedrails?: None Help needed moving from lying on your back to sitting on the side of a flat bed without using bedrails?: None Help needed moving to and from a bed to a chair (including a wheelchair)?: None Help needed standing up from a chair using your arms (e.g., wheelchair or bedside chair)?:  None Help needed to walk in hospital room?: None Help needed climbing 3-5 steps with a railing? : None 6 Click Score: 24    End of Session Equipment Utilized During Treatment: Gait belt Activity Tolerance: Patient tolerated treatment well Patient left: in bed;with bed alarm set;with SCD's reapplied Nurse Communication: Mobility status PT Visit Diagnosis: Muscle weakness (generalized) (M62.81);Difficulty in walking, not elsewhere classified (R26.2);Pain Pain - Right/Left: Right Pain - part of body: Knee     Time: 3149-7026 PT Time Calculation (min) (ACUTE ONLY): 25 min  Charges:  $Gait Training: 8-22 mins $Therapeutic Exercise: 8-22 mins                     Greggory Stallion, PT, DPT [phone]    Ashante Snelling 11/24/2018, 11:13 AM

## 2018-11-24 NOTE — Care Management Important Message (Signed)
Important Message  Patient Details  Name: Mackenzie White MRN: [id number] Date of Birth: February 19, 1940   Medicare Important Message Given:  Yes     Dannette Barbara 11/24/2018, 11:03 AM

## 2018-11-24 NOTE — Discharge Summary (Signed)
Physician Discharge Summary  Patient ID: Mackenzie White MRN: [id number] DOB/AGE: 09/13/1940 78 y.o.  Admit date: 11/22/2018 Discharge date: 11/24/2018  Admission Diagnoses:  PRIMARY OSTEOARTHRITIS OF RIGHT KNEE.  Surgeries:Procedure(s):  Right total knee arthroplasty using computer-assisted navigation  SURGEON:  Marciano Sequin. M.D.  ASSISTANT: Cassell Smiles, PA-C (present and scrubbed throughout the case, critical for assistance with exposure, retraction, instrumentation, and closure)  ANESTHESIA: spinal  ESTIMATED BLOOD LOSS: 50 mL  FLUIDS REPLACED: 2200 mL of crystalloid  TOURNIQUET TIME: 110 minutes  DRAINS: 2 medium Hemovac drains  SOFT TISSUE RELEASES: Anterior cruciate ligament, posterior cruciate ligament, deep medial collateral ligament, patellofemoral ligament  IMPLANTS UTILIZED: DePuy Attune size 5N posterior stabilized femoral component (cemented), size 5 rotating platform tibial component (cemented), 38 mm medialized dome patella (cemented), and a 5 mm stabilized rotating platform polyethylene insert.  Discharge Diagnoses: Patient Active Problem List   Diagnosis Date Noted  . 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 Depression 07/03/2018  . GERD (gastroesophageal reflux disease) 07/03/2018  . Hypertension 07/03/2018  . Status post total left knee replacement 07/03/2018  . Primary osteoarthritis of both knees 04/23/2018  . Primary osteoarthritis of right knee 04/23/2018    Past Medical History:  Diagnosis Date  . Allergy   . Arthritis   . Cancer ([place])    basal cell skin cancer  . Carpal tunnel syndrome of right wrist   . Cataract    bilateral eyes in 2015  . Depression   . Diverticulosis   . Gallstones   . GERD (gastroesophageal reflux disease)   . History of gluten intolerance   . History of hiatal hernia    big per pt  . Hyperlipemia   . Hypertension      Transfusion: n/a   Consultants (if any):   Discharged Condition:  Improved  Hospital Course: [name] is an 78 y.o. female who was admitted 11/22/2018 with a diagnosis of right knee osteoarthritis and went to the operating room on 11/22/2018 and underwent right total knee arthroplasty. The patient received perioperative antibiotics for prophylaxis (see below). The patient tolerated the procedure well and was transported to PACU in stable condition. After meeting PACU criteria, the patient was subsequently transferred to the Orthopaedics/Rehabilitation unit.   The patient received DVT prophylaxis in the form of early mobilization, Lovenox, Foot Pumps and TED hose. A sacral pad had been placed and heels were elevated off of the bed with rolled towels in order to protect skin integrity. Foley catheter was discontinued on postoperative day #1. Wound drains were discontinued on postoperative day #2. The surgical incision was healing well without signs of infection.  Physical therapy was initiated postoperatively for transfers, gait training, and strengthening. Occupational therapy was initiated for activities of daily living and evaluation for assisted devices. Rehabilitation goals were reviewed in detail with the patient. The patient made steady progress with physical therapy and physical therapy recommended discharge to Home.   The patient achieved her preliminary goals of this hospitalization and was felt to be medically and orthopaedically appropriate for discharge.  She was given perioperative antibiotics:  Anti-infectives (From admission, onward)   Start     Dose/Rate Route Frequency Ordered Stop   11/22/18 1500  clindamycin (CLEOCIN) IVPB 600 mg     600 mg 100 mL/hr over 30 Minutes Intravenous Every 6 hours 11/22/18 1441 11/23/18 0851   11/22/18 0703  clindamycin (CLEOCIN) 900 MG/50ML IVPB    Note to Pharmacy: Sylvester Harder   :  cabinet override      11/22/18 0703 11/22/18 0830   11/22/18 0600  clindamycin (CLEOCIN) IVPB 900 mg  Status:  Discontinued      900 mg 100 mL/hr over 30 Minutes Intravenous On call to O.R. 11/21/18 2305 11/22/18 0703    .  Recent vital signs:  Vitals:   11/23/18 1609 11/24/18 0019  BP: (!) 115/59 132/68  Pulse: 75 76  Resp:  17  Temp: 97.7 F (36.5 C) 97.6 F (36.4 C)  SpO2: 99% 97%    Recent laboratory studies:  No results for input(s): WBC, HGB, HCT, PLT, K, CL, CO2, BUN, CREATININE, GLUCOSE, CALCIUM, LABPT, INR in the last 72 hours.  Diagnostic Studies: Dg Knee Right Port  Result Date: 11/22/2018 CLINICAL DATA:  Primary degenerative arthrosis of the right knee. Status post right total knee replacement. EXAM: PORTABLE RIGHT KNEE - 1-2 VIEW COMPARISON:  None. FINDINGS: AP and lateral views of the right knee demonstrate that the components of the total knee prosthesis appear in excellent position. No fractures. Joint drains in place. IMPRESSION: Satisfactory appearance of the right knee after total knee replacement. Electronically Signed   By: Lorriane Shire M.D.   On: 11/22/2018 13:32    Discharge Medications:   Allergies as of 11/24/2018      Reactions   Asa [aspirin] Hives   Gluten Meal Diarrhea, Nausea Only   Bloating, GI pain    Penicillins Swelling   Did it involve swelling of the face/tongue/throat, SOB, or low BP? Yes Did it involve sudden or severe rash/hives, skin peeling, or any reaction on the inside of your mouth or nose? No Did you need to seek medical attention at a hospital or doctor's office? Yes When did it last happen?50+ years If all above answers are "NO", may proceed with cephalosporin use.   Sulfa Antibiotics Hives   Tetracyclines & Related Hives      Medication List    STOP taking these medications   meloxicam 7.5 MG tablet Commonly known as: MOBIC     TAKE these medications   acetaminophen 500 MG tablet Commonly known as: TYLENOL Take 1,000 mg by mouth at bedtime.   ALPRAZolam 0.25 MG tablet Commonly known as: XANAX Take 0.25 mg by mouth daily as needed for  anxiety.   CALCIUM 600 + D PO Take 1 tablet by mouth at bedtime.   celecoxib 200 MG capsule Commonly known as: CELEBREX Take 1 capsule (200 mg total) by mouth 2 (two) times daily.   diphenhydrAMINE 25 MG tablet Commonly known as: BENADRYL Take 25 mg by mouth at bedtime.   docusate sodium 100 MG capsule Commonly known as: COLACE Take 200 mg by mouth at bedtime.   DULoxetine 60 MG capsule Commonly known as: CYMBALTA Take 60 mg by mouth every morning.   enoxaparin 40 MG/0.4ML injection Commonly known as: LOVENOX Inject 0.4 mLs (40 mg total) into the skin daily for 14 days.   fluticasone 50 MCG/ACT nasal spray Commonly known as: FLONASE Place 1 spray into both nostrils daily as needed for allergies or rhinitis.   GAS-X PO Take 1 tablet by mouth daily as needed (gas).   lisinopril-hydrochlorothiazide 20-12.5 MG tablet Commonly known as: ZESTORETIC Take 1 tablet by mouth at bedtime.   NexIUM 40 MG capsule Generic drug: esomeprazole Take 1 capsule (40 mg total) by mouth daily at 12 noon. What changed: when to take this   oxyCODONE 5 MG immediate release tablet Commonly known as: Oxy IR/ROXICODONE Take 1  tablet (5 mg total) by mouth every 6 (six) hours as needed for moderate pain (pain score 4-6).   Probiotic Caps Take 1 capsule by mouth daily.   simvastatin 80 MG tablet Commonly known as: ZOCOR Take 80 mg by mouth at bedtime.   traMADol 50 MG tablet Commonly known as: ULTRAM Take 1 tablet (50 mg total) by mouth every 4 (four) hours as needed for moderate pain.            Durable Medical Equipment  (From admission, onward)         Start     Ordered   11/22/18 1442  DME Walker rolling  Once    Question:  Patient needs a walker to treat with the following condition  Answer:  Total knee replacement status   11/22/18 1441   11/22/18 1442  DME Bedside commode  Once    Question:  Patient needs a bedside commode to treat with the following condition  Answer:   Total knee replacement status   11/22/18 1441          Disposition: Discharge disposition: 01-Home or Self Care         Follow-up Information    Urbano Heir On 12/07/2018.   Specialty: Orthopedic Surgery Why: at 1:45pm Contact information: [address] [place] [phone]        Dereck Leep, MD On 01/04/2019.   Specialty: Orthopedic Surgery Why: at 3:30pm Contact information: [place] [id number] Walker, PA-C 11/24/2018, 7:32 AM

## 2019-08-07 ENCOUNTER — Other Ambulatory Visit: Payer: Self-pay | Admitting: Internal Medicine

## 2019-08-07 DIAGNOSIS — Z1231 Encounter for screening mammogram for malignant neoplasm of breast: Secondary | ICD-10-CM

## 2019-10-16 ENCOUNTER — Ambulatory Visit
Admission: RE | Admit: 2019-10-16 | Discharge: 2019-10-16 | Disposition: A | Discharge status: Home / Self Care | Payer: Medicare PPO | Source: Ambulatory Visit | Attending: Internal Medicine | Admitting: Internal Medicine

## 2019-10-16 ENCOUNTER — Other Ambulatory Visit: Payer: Self-pay

## 2019-10-16 DIAGNOSIS — Z1231 Encounter for screening mammogram for malignant neoplasm of breast: Secondary | ICD-10-CM

## 2020-02-21 DIAGNOSIS — I119 Hypertensive heart disease without heart failure: Secondary | ICD-10-CM | POA: Diagnosis not present

## 2020-02-21 DIAGNOSIS — J4520 Mild intermittent asthma, uncomplicated: Secondary | ICD-10-CM | POA: Diagnosis not present

## 2020-02-21 DIAGNOSIS — I2510 Atherosclerotic heart disease of native coronary artery without angina pectoris: Secondary | ICD-10-CM | POA: Diagnosis not present

## 2020-02-21 DIAGNOSIS — E7800 Pure hypercholesterolemia, unspecified: Secondary | ICD-10-CM | POA: Diagnosis not present

## 2020-02-21 DIAGNOSIS — K219 Gastro-esophageal reflux disease without esophagitis: Secondary | ICD-10-CM | POA: Diagnosis not present

## 2020-02-21 DIAGNOSIS — F331 Major depressive disorder, recurrent, moderate: Secondary | ICD-10-CM | POA: Diagnosis not present

## 2020-02-21 DIAGNOSIS — K760 Fatty (change of) liver, not elsewhere classified: Secondary | ICD-10-CM | POA: Diagnosis not present

## 2020-02-21 DIAGNOSIS — I77819 Aortic ectasia, unspecified site: Secondary | ICD-10-CM | POA: Diagnosis not present

## 2020-02-21 DIAGNOSIS — R7302 Impaired glucose tolerance (oral): Secondary | ICD-10-CM | POA: Diagnosis not present

## 2020-03-28 DIAGNOSIS — Z961 Presence of intraocular lens: Secondary | ICD-10-CM | POA: Diagnosis not present

## 2020-03-28 DIAGNOSIS — H04123 Dry eye syndrome of bilateral lacrimal glands: Secondary | ICD-10-CM | POA: Diagnosis not present

## 2020-04-22 DIAGNOSIS — K219 Gastro-esophageal reflux disease without esophagitis: Secondary | ICD-10-CM | POA: Diagnosis not present

## 2020-04-22 DIAGNOSIS — I119 Hypertensive heart disease without heart failure: Secondary | ICD-10-CM | POA: Diagnosis not present

## 2020-04-22 DIAGNOSIS — J302 Other seasonal allergic rhinitis: Secondary | ICD-10-CM | POA: Diagnosis not present

## 2020-04-22 DIAGNOSIS — R7302 Impaired glucose tolerance (oral): Secondary | ICD-10-CM | POA: Diagnosis not present

## 2020-04-22 DIAGNOSIS — R5383 Other fatigue: Secondary | ICD-10-CM | POA: Diagnosis not present

## 2020-08-08 DIAGNOSIS — E7800 Pure hypercholesterolemia, unspecified: Secondary | ICD-10-CM | POA: Diagnosis not present

## 2020-08-08 DIAGNOSIS — R7302 Impaired glucose tolerance (oral): Secondary | ICD-10-CM | POA: Diagnosis not present

## 2020-08-15 DIAGNOSIS — D692 Other nonthrombocytopenic purpura: Secondary | ICD-10-CM | POA: Diagnosis not present

## 2020-08-15 DIAGNOSIS — Z96652 Presence of left artificial knee joint: Secondary | ICD-10-CM | POA: Diagnosis not present

## 2020-08-15 DIAGNOSIS — F331 Major depressive disorder, recurrent, moderate: Secondary | ICD-10-CM | POA: Diagnosis not present

## 2020-08-15 DIAGNOSIS — Z0000 Encounter for general adult medical examination without abnormal findings: Secondary | ICD-10-CM | POA: Diagnosis not present

## 2020-08-15 DIAGNOSIS — K760 Fatty (change of) liver, not elsewhere classified: Secondary | ICD-10-CM | POA: Diagnosis not present

## 2020-08-15 DIAGNOSIS — J302 Other seasonal allergic rhinitis: Secondary | ICD-10-CM | POA: Diagnosis not present

## 2020-08-15 DIAGNOSIS — R82998 Other abnormal findings in urine: Secondary | ICD-10-CM | POA: Diagnosis not present

## 2020-08-15 DIAGNOSIS — E7800 Pure hypercholesterolemia, unspecified: Secondary | ICD-10-CM | POA: Diagnosis not present

## 2020-08-15 DIAGNOSIS — I2510 Atherosclerotic heart disease of native coronary artery without angina pectoris: Secondary | ICD-10-CM | POA: Diagnosis not present

## 2020-08-15 DIAGNOSIS — Z1331 Encounter for screening for depression: Secondary | ICD-10-CM | POA: Diagnosis not present

## 2020-08-15 DIAGNOSIS — I119 Hypertensive heart disease without heart failure: Secondary | ICD-10-CM | POA: Diagnosis not present

## 2020-12-12 ENCOUNTER — Other Ambulatory Visit: Payer: Self-pay | Admitting: Internal Medicine

## 2020-12-12 DIAGNOSIS — Z1231 Encounter for screening mammogram for malignant neoplasm of breast: Secondary | ICD-10-CM

## 2021-01-20 ENCOUNTER — Ambulatory Visit
Admission: RE | Admit: 2021-01-20 | Discharge: 2021-01-20 | Disposition: A | Discharge status: Home / Self Care | Payer: Medicare PPO | Source: Ambulatory Visit | Attending: Internal Medicine | Admitting: Internal Medicine

## 2021-01-20 DIAGNOSIS — Z1231 Encounter for screening mammogram for malignant neoplasm of breast: Secondary | ICD-10-CM

## 2021-02-17 DIAGNOSIS — R7302 Impaired glucose tolerance (oral): Secondary | ICD-10-CM | POA: Diagnosis not present

## 2021-02-17 DIAGNOSIS — J4520 Mild intermittent asthma, uncomplicated: Secondary | ICD-10-CM | POA: Diagnosis not present

## 2021-02-17 DIAGNOSIS — D692 Other nonthrombocytopenic purpura: Secondary | ICD-10-CM | POA: Diagnosis not present

## 2021-02-17 DIAGNOSIS — K760 Fatty (change of) liver, not elsewhere classified: Secondary | ICD-10-CM | POA: Diagnosis not present

## 2021-02-17 DIAGNOSIS — I77819 Aortic ectasia, unspecified site: Secondary | ICD-10-CM | POA: Diagnosis not present

## 2021-02-17 DIAGNOSIS — E7800 Pure hypercholesterolemia, unspecified: Secondary | ICD-10-CM | POA: Diagnosis not present

## 2021-02-17 DIAGNOSIS — I119 Hypertensive heart disease without heart failure: Secondary | ICD-10-CM | POA: Diagnosis not present

## 2021-02-17 DIAGNOSIS — M4302 Spondylolysis, cervical region: Secondary | ICD-10-CM | POA: Diagnosis not present

## 2021-02-17 DIAGNOSIS — I2510 Atherosclerotic heart disease of native coronary artery without angina pectoris: Secondary | ICD-10-CM | POA: Diagnosis not present

## 2021-06-19 DIAGNOSIS — H43812 Vitreous degeneration, left eye: Secondary | ICD-10-CM | POA: Diagnosis not present

## 2021-06-19 DIAGNOSIS — Z961 Presence of intraocular lens: Secondary | ICD-10-CM | POA: Diagnosis not present

## 2021-08-27 DIAGNOSIS — R7989 Other specified abnormal findings of blood chemistry: Secondary | ICD-10-CM | POA: Diagnosis not present

## 2021-08-27 DIAGNOSIS — R5383 Other fatigue: Secondary | ICD-10-CM | POA: Diagnosis not present

## 2021-08-27 DIAGNOSIS — R7302 Impaired glucose tolerance (oral): Secondary | ICD-10-CM | POA: Diagnosis not present

## 2021-08-27 DIAGNOSIS — E7800 Pure hypercholesterolemia, unspecified: Secondary | ICD-10-CM | POA: Diagnosis not present

## 2021-09-03 DIAGNOSIS — J302 Other seasonal allergic rhinitis: Secondary | ICD-10-CM | POA: Diagnosis not present

## 2021-09-03 DIAGNOSIS — J4520 Mild intermittent asthma, uncomplicated: Secondary | ICD-10-CM | POA: Diagnosis not present

## 2021-09-03 DIAGNOSIS — Z1331 Encounter for screening for depression: Secondary | ICD-10-CM | POA: Diagnosis not present

## 2021-09-03 DIAGNOSIS — F331 Major depressive disorder, recurrent, moderate: Secondary | ICD-10-CM | POA: Diagnosis not present

## 2021-09-03 DIAGNOSIS — Z1389 Encounter for screening for other disorder: Secondary | ICD-10-CM | POA: Diagnosis not present

## 2021-09-03 DIAGNOSIS — E7800 Pure hypercholesterolemia, unspecified: Secondary | ICD-10-CM | POA: Diagnosis not present

## 2021-09-03 DIAGNOSIS — I119 Hypertensive heart disease without heart failure: Secondary | ICD-10-CM | POA: Diagnosis not present

## 2021-09-03 DIAGNOSIS — D692 Other nonthrombocytopenic purpura: Secondary | ICD-10-CM | POA: Diagnosis not present

## 2021-09-03 DIAGNOSIS — R0609 Other forms of dyspnea: Secondary | ICD-10-CM | POA: Diagnosis not present

## 2021-09-03 DIAGNOSIS — Z0000 Encounter for general adult medical examination without abnormal findings: Secondary | ICD-10-CM | POA: Diagnosis not present

## 2021-09-03 DIAGNOSIS — I2510 Atherosclerotic heart disease of native coronary artery without angina pectoris: Secondary | ICD-10-CM | POA: Diagnosis not present

## 2021-09-28 ENCOUNTER — Ambulatory Visit
Admission: RE | Admit: 2021-09-28 | Discharge: 2021-09-28 | Disposition: A | Discharge status: Home / Self Care | Payer: Medicare PPO | Source: Ambulatory Visit | Attending: Cardiology | Admitting: Cardiology

## 2021-09-28 ENCOUNTER — Encounter: Payer: Self-pay | Admitting: Cardiology

## 2021-09-28 ENCOUNTER — Ambulatory Visit: Payer: Medicare PPO | Admitting: Cardiology

## 2021-09-28 VITALS — BP 139/81 | HR 84 | Temp 98.0°F | Resp 16 | Ht 66.0 in | Wt 190.2 lb

## 2021-09-28 DIAGNOSIS — I10 Essential (primary) hypertension: Secondary | ICD-10-CM | POA: Diagnosis not present

## 2021-09-28 DIAGNOSIS — K219 Gastro-esophageal reflux disease without esophagitis: Secondary | ICD-10-CM

## 2021-09-28 DIAGNOSIS — E7800 Pure hypercholesterolemia, unspecified: Secondary | ICD-10-CM | POA: Diagnosis not present

## 2021-09-28 DIAGNOSIS — R0609 Other forms of dyspnea: Secondary | ICD-10-CM

## 2021-09-28 DIAGNOSIS — R0602 Shortness of breath: Secondary | ICD-10-CM | POA: Diagnosis not present

## 2021-09-28 MED ORDER — FAMOTIDINE 40 MG PO TABS: 40.0000 mg | ORAL_TABLET | Freq: Every day | ORAL | 2 refills | Status: DC

## 2021-09-28 NOTE — Progress Notes (Signed)
Primary Physician/Referring:  Tisovec, Fransico Him, MD  Patient ID: Mackenzie White, female    DOB: 03-28-1940, 81 y.o.   MRN: [id number]  Chief Complaint  Patient presents with   [facility] (Initial Visit)    Referred by Domenick Gong, MD   HPI:    Mackenzie White  is a 81 y.o. Caucasian female with hypertension, hyperlipidemia, hyperglycemia referred to me for evaluation of dyspnea on exertion.  Patient underwent bilateral knee replacement in 2020, she reduced her physical activity since then and has not been going to the gym or having routine exercise.  Since then she has noticed decreased exercise capacity.  No chest pain, no PND or orthopnea or leg edema.  Past Medical History:  Diagnosis Date   Allergy    Arthritis    Cancer ([place])    basal cell skin cancer   Carpal tunnel syndrome of right wrist    Cataract    bilateral eyes in 2015   Depression    Diverticulosis    Gallstones    GERD (gastroesophageal reflux disease)    History of gluten intolerance    History of hiatal hernia    big per pt   Hyperlipemia    Hypertension    Past Surgical History:  Procedure Laterality Date   ABDOMINAL HYSTERECTOMY     ACHILLES TENDON SURGERY Right    APPENDECTOMY     CARPAL TUNNEL RELEASE  07/23/2011   Procedure: CARPAL TUNNEL RELEASE;  Surgeon: Cammie Sickle., MD;  Location: [facility];  Service: Orthopedics;  Laterality: Right;   CHOLECYSTECTOMY     COLONOSCOPY WITH ESOPHAGOGASTRODUODENOSCOPY (EGD)     KNEE ARTHROPLASTY Left 07/03/2018   Procedure: COMPUTER ASSISTED TOTAL KNEE ARTHROPLASTY - LEFT;  Surgeon: Dereck Leep, MD;  Location: [facility];  Service: Orthopedics;  Laterality: Left;   KNEE ARTHROPLASTY Right 11/22/2018   Procedure: COMPUTER ASSISTED TOTAL KNEE ARTHROPLASTY - RNFA;  Surgeon: Dereck Leep, MD;  Location: [facility];  Service: Orthopedics;  Laterality: Right;   TUBAL LIGATION     Family History  Problem Relation Age of Onset    Emphysema Father    Stroke Brother    Transient ischemic attack Brother    Factor V Leiden deficiency Son    Colon cancer Neg Hx    Stomach cancer Neg Hx    Rectal cancer Neg Hx    Esophageal cancer Neg Hx     Social History   Tobacco Use   Smoking status: Never   Smokeless tobacco: Never  Substance Use Topics   Alcohol use: No   Marital Status: Widowed  ROS  Review of Systems  Cardiovascular:  Positive for dyspnea on exertion. Negative for chest pain and leg swelling.  Gastrointestinal:  Positive for heartburn.   Objective      09/28/2021    9:39 AM 11/24/2018    2:57 PM 11/24/2018    8:15 AM  Vitals with BMI  Height 5' 6"     Weight 190 lbs 3 oz    BMI 10.17    Systolic 510 258 527  Diastolic 81 94 68  Pulse 84 80 80   Today's Vitals   09/28/21 0939  BP: 139/81  Pulse: 84  Resp: 16  Temp: 98 F (36.7 C)  TempSrc: Temporal  SpO2: 95%  Weight: 190 lb 3.2 oz (86.3 kg)  Height: 5' 6"  (1.676 m)   Body mass index is 30.7 kg/m.  Physical  Exam Neck:     Vascular: No carotid bruit or JVD.  Cardiovascular:     Rate and Rhythm: Normal rate and regular rhythm.     Pulses: Intact distal pulses.     Heart sounds: Normal heart sounds. No murmur heard.    No gallop.  Pulmonary:     Effort: Pulmonary effort is normal.     Breath sounds: Normal breath sounds.  Abdominal:     General: Bowel sounds are normal.     Palpations: Abdomen is soft.  Musculoskeletal:     Right lower leg: No edema.     Left lower leg: No edema.     Medications and allergies   Allergies  Allergen Reactions   Asa [Aspirin] Hives   Gluten Meal Diarrhea and Nausea Only    Bloating, GI pain    Penicillins Swelling    Did it involve swelling of the face/tongue/throat, SOB, or low BP? Yes Did it involve sudden or severe rash/hives, skin peeling, or any reaction on the inside of your mouth or nose? No Did you need to seek medical attention at a hospital or doctor's office? Yes When  did it last happen?      50+ years If all above answers are "NO", may proceed with cephalosporin use.    Sulfa Antibiotics Hives   Tetracyclines & Related Hives     Medication list after today's encounter   Current Outpatient Medications:    acetaminophen (TYLENOL) 500 MG tablet, Take 1,000 mg by mouth at bedtime. , Disp: , Rfl:    albuterol (VENTOLIN HFA) 108 (90 Base) MCG/ACT inhaler, SMARTSIG:1 Puff(s) Via Inhaler Every 4 Hours PRN, Disp: , Rfl:    ALPRAZolam (XANAX) 0.25 MG tablet, Take 0.25 mg by mouth daily as needed for anxiety., Disp: , Rfl:    Calcium Carb-Cholecalciferol (CALCIUM 600 + D PO), Take 1 tablet by mouth at bedtime., Disp: , Rfl:    cetirizine (ZYRTEC) 10 MG tablet, Take 10 mg by mouth daily., Disp: , Rfl:    Cyanocobalamin (B-12) 1000 MCG TABS, Take 1 tablet by mouth daily Oral, Disp: , Rfl:    docusate sodium (COLACE) 100 MG capsule, Take 200 mg by mouth at bedtime., Disp: , Rfl:    DULoxetine (CYMBALTA) 60 MG capsule, Take 60 mg by mouth every morning. , Disp: , Rfl:    famotidine (PEPCID) 40 MG tablet, Take 1 tablet (40 mg total) by mouth daily., Disp: 30 tablet, Rfl: 2   fluticasone (FLONASE) 50 MCG/ACT nasal spray, Place 1 spray into both nostrils daily as needed for allergies or rhinitis., Disp: , Rfl:    lisinopril-hydrochlorothiazide (PRINZIDE,ZESTORETIC) 20-12.5 MG per tablet, Take 1 tablet by mouth at bedtime. , Disp: , Rfl:    Probiotic CAPS, Take 1 capsule by mouth daily., Disp: , Rfl:    simvastatin (ZOCOR) 80 MG tablet, Take 80 mg by mouth at bedtime., Disp: , Rfl:    NEXIUM 40 MG capsule, Take 1 capsule (40 mg total) by mouth daily as needed (If GERD severe)., Disp: 30 capsule, Rfl: 11  Laboratory examination:   External labs:   Labs 08/27/2021:  Serum glucose 1 1 9  mg, BUN 15, creatinine 0.8, EGFR 69 mL, potassium 4.4.  LFTs normal.  A1c 6.0%.  Hb 13.9, HCT 41.6, platelets 280, normal indicis.  Total cholesterol 160, triglycerides 194, HDL  46, LDL 75.  Apolipoprotein B minimally elevated 95 (49-90)  Radiology:    Cardiac Studies:   NA  EKG:  EKG 09/28/2021: Normal sinus rhythm at rate of 83 bpm, left axis deviation, left ant fascicular block.  Right bundle branch block.  Bifascicular block.  Nonspecific T abnormality high lateral leads.    Assessment     ICD-10-CM   1. Primary hypertension  I10 EKG 12-Lead    2. Pure hypercholesterolemia  E78.00     3. Dyspnea on exertion  R06.09 PCV ECHOCARDIOGRAM COMPLETE    PCV CARDIAC STRESS TEST    DG Chest 2 View    4. Gastroesophageal reflux disease without esophagitis  K21.9 famotidine (PEPCID) 40 MG tablet    NEXIUM 40 MG capsule       Orders Placed This Encounter  Procedures   DG Chest 2 View    Standing Status:   Future    Standing Expiration Date:   11/28/2021    Order Specific Question:   Reason for Exam (SYMPTOM  OR DIAGNOSIS REQUIRED)    Answer:   Shortness of breath and GERD    Order Specific Question:   Preferred imaging location?    Answer:   [facility]    Order Specific Question:   Radiology Contrast Protocol - do NOT remove file path    Answer:   [email or web address]   PCV CARDIAC STRESS TEST    Standing Status:   Future    Standing Expiration Date:   11/28/2021   EKG 12-Lead   PCV ECHOCARDIOGRAM COMPLETE    Standing Status:   Future    Standing Expiration Date:   09/29/2022    Meds ordered this encounter  Medications   famotidine (PEPCID) 40 MG tablet    Sig: Take 1 tablet (40 mg total) by mouth daily.    Dispense:  30 tablet    Refill:  2    Medications Discontinued During This Encounter  Medication Reason   enoxaparin (LOVENOX) 40 MG/0.4ML injection Completed Course   diphenhydrAMINE (BENADRYL) 25 MG tablet    oxyCODONE (OXY IR/ROXICODONE) 5 MG immediate release tablet    Simethicone (GAS-X PO)    traMADol (ULTRAM) 50 MG tablet    NEXIUM 40 MG capsule      Recommendations:    Mackenzie White is a 81 y.o. Caucasian female with hypertension, hyperlipidemia, hyperglycemia referred to me for evaluation of dyspnea on exertion.  Patient underwent bilateral knee replacement in 2020, she reduced her physical activity since then and has not been going to the gym or having routine exercise.  Since then she has noticed decreased exercise capacity.  No history to suggest DVT or PE.  Physical examination except for mild obesity is unremarkable.  Blood pressure is well controlled, reviewed her labs, triglycerides are slightly elevated however patient has made lifestyle changes since then, she had recently lost her son and she was eating poorly at the time.  Overall LDL is well controlled and she is presently on high intensity statins.  Suspect her dyspnea on exertion is probably related to a combination of deconditioning, obesity, age-related diastolic dysfunction, GERD.  My suspicion for coronary disease is relatively low in spite of her multiple medical comorbidity.  I will schedule her for a routine treadmill exercise stress test and also perform a echocardiogram.  Unless these are abnormal I will see him back on a as needed basis.  I will also obtain chest x-ray PA and lateral view.  With regard to GERD, she has been skeptical and worried about using Nexium on a long-term basis, I switched her to Pepcid 40 mg daily  and she can still continue the Nexium on a as needed basis.    Adrian Prows, MD, [facility] 09/28/2021, 10:13 AM Office: [phone]

## 2021-09-29 NOTE — Progress Notes (Signed)
Spoke with patient and advised patient of CXR results. [place]

## 2021-09-29 NOTE — Progress Notes (Signed)
Normal CXR

## 2021-10-22 ENCOUNTER — Ambulatory Visit: Payer: Medicare PPO

## 2021-10-22 DIAGNOSIS — R0609 Other forms of dyspnea: Secondary | ICD-10-CM | POA: Diagnosis not present

## 2021-10-22 NOTE — Progress Notes (Signed)
Normal stress test. Discuss on OV soon

## 2021-10-23 NOTE — Progress Notes (Signed)
Called pt to inform her about her stress results. Pt understood

## 2021-11-03 DIAGNOSIS — L218 Other seborrheic dermatitis: Secondary | ICD-10-CM | POA: Diagnosis not present

## 2021-11-06 ENCOUNTER — Ambulatory Visit: Payer: Medicare PPO

## 2021-11-06 DIAGNOSIS — R0609 Other forms of dyspnea: Secondary | ICD-10-CM | POA: Diagnosis not present

## 2021-11-10 NOTE — Progress Notes (Signed)
Zplease encourage her to keep the appointment for stress test.  Echo appears normal with minor abnormlity

## 2021-11-11 NOTE — Progress Notes (Signed)
Sorry, I see this now. All looks good. Do not think cardiac issues. No need for an appointment unless she has concerns

## 2021-11-11 NOTE — Progress Notes (Signed)
Called pt to inform her about her echo results pt mention she already had her stress test back in 10/22/2021. Pt would like to know does she need to make a f/u appt.

## 2021-11-12 NOTE — Progress Notes (Signed)
Called pt to inform her about the message above. Pt understood.

## 2021-12-28 ENCOUNTER — Other Ambulatory Visit: Payer: Self-pay | Admitting: Cardiology

## 2021-12-28 DIAGNOSIS — K219 Gastro-esophageal reflux disease without esophagitis: Secondary | ICD-10-CM

## 2022-02-09 DIAGNOSIS — R0981 Nasal congestion: Secondary | ICD-10-CM | POA: Diagnosis not present

## 2022-02-09 DIAGNOSIS — J302 Other seasonal allergic rhinitis: Secondary | ICD-10-CM | POA: Diagnosis not present

## 2022-02-09 DIAGNOSIS — J4520 Mild intermittent asthma, uncomplicated: Secondary | ICD-10-CM | POA: Diagnosis not present

## 2022-02-09 DIAGNOSIS — I119 Hypertensive heart disease without heart failure: Secondary | ICD-10-CM | POA: Diagnosis not present

## 2022-02-09 DIAGNOSIS — J101 Influenza due to other identified influenza virus with other respiratory manifestations: Secondary | ICD-10-CM | POA: Diagnosis not present

## 2022-02-17 IMAGING — MG MM DIGITAL SCREENING BILAT W/ TOMO AND CAD
8 series · 9 of 24 positions shown · non-contrast
Comparison: Previous exam(s).

CLINICAL DATA: Screening.

EXAM:
DIGITAL SCREENING BILATERAL MAMMOGRAM WITH TOMOSYNTHESIS AND CAD
TECHNIQUE: Bilateral screening digital craniocaudal and mediolateral oblique
mammograms were obtained. Bilateral screening digital breast
tomosynthesis was performed. The images were evaluated with
computer-aided detection.

[L MLO synth-2D]
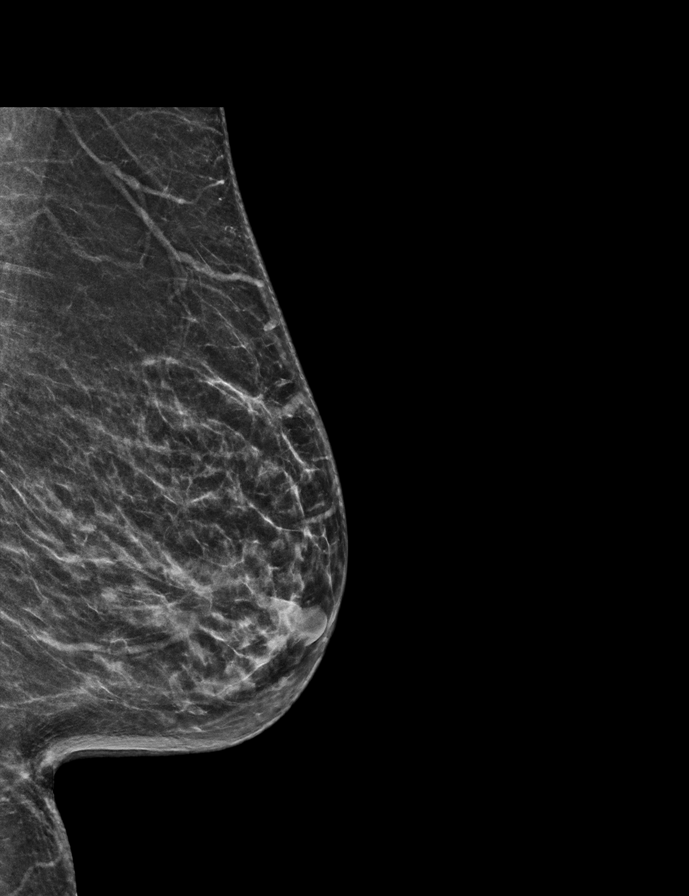

[L CC synth-2D]
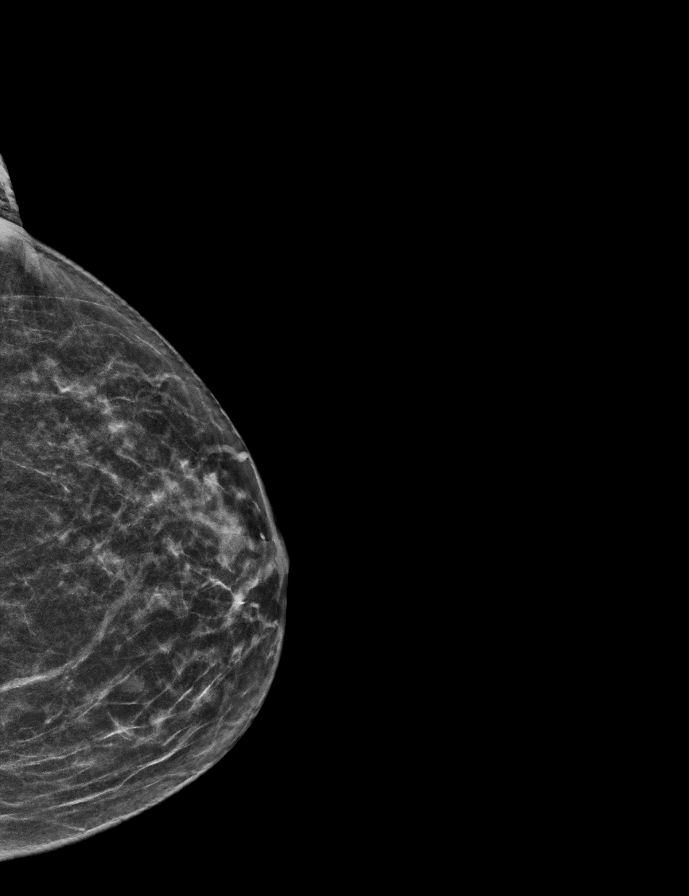

[R CC synth-2D]
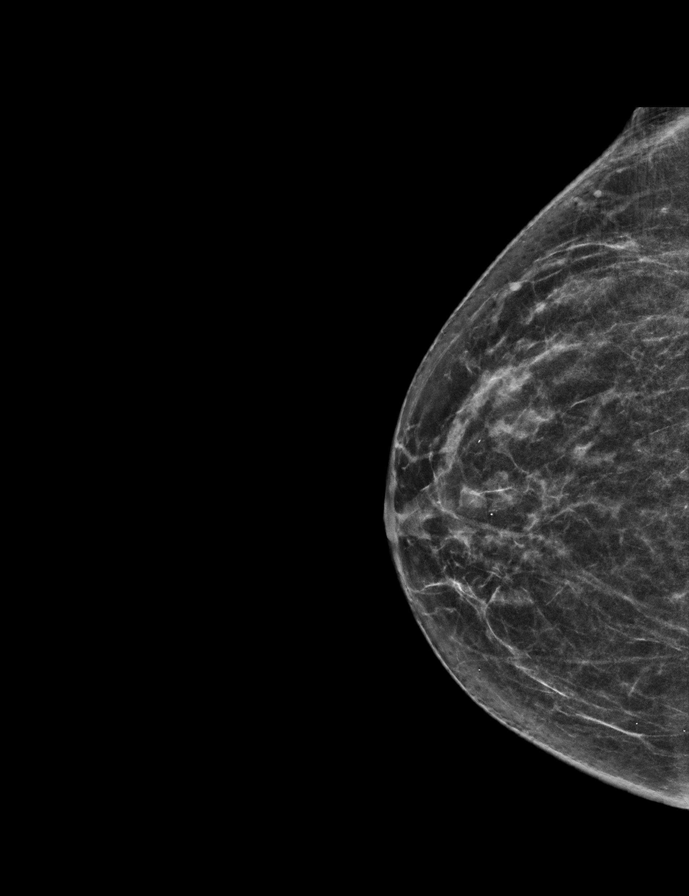

[R MLO synth-2D]
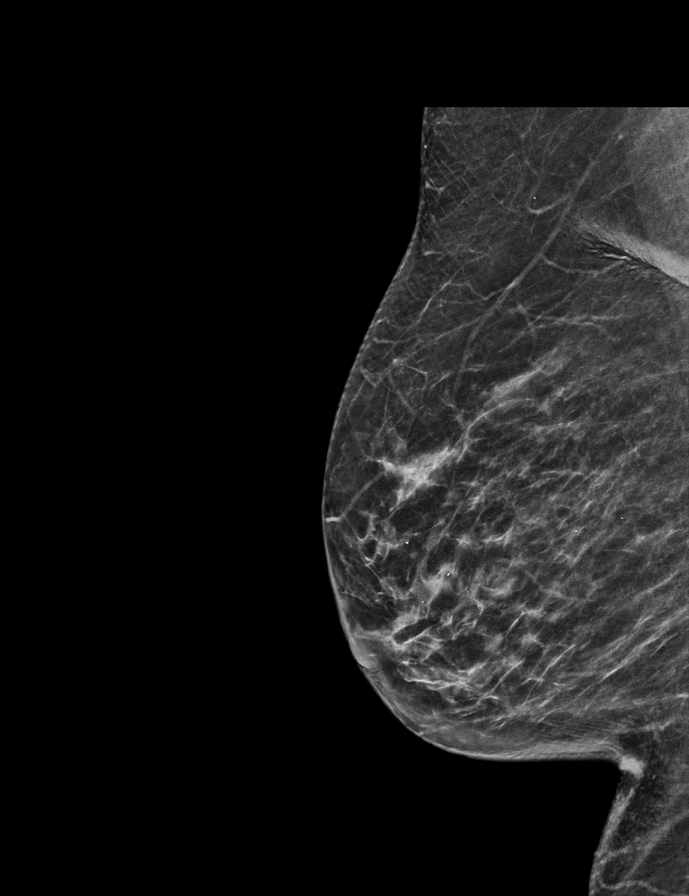

[L CC tomo · 2 of 50 frames shown]
[frame 17/50]
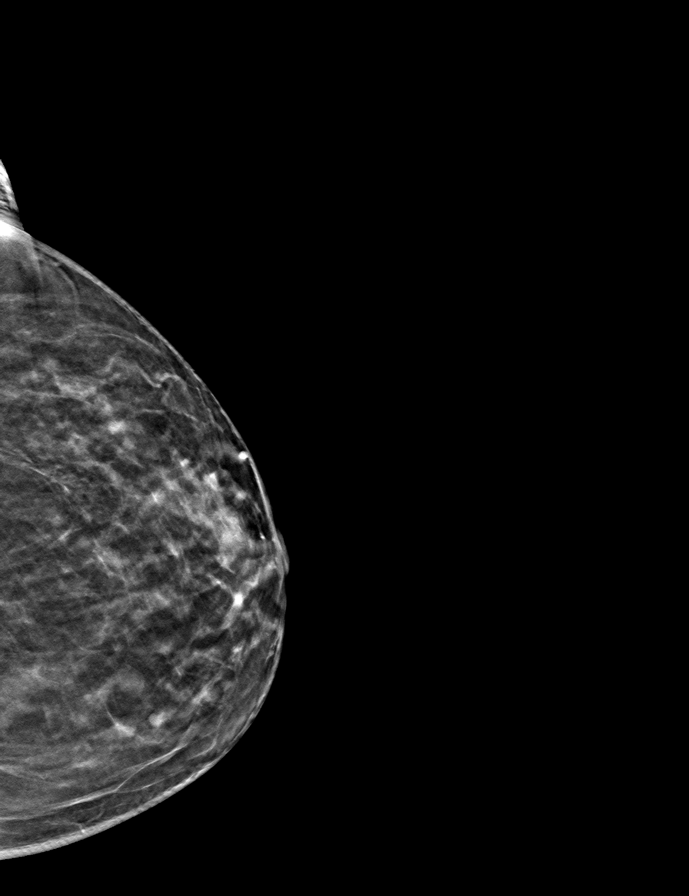
[frame 25/50]
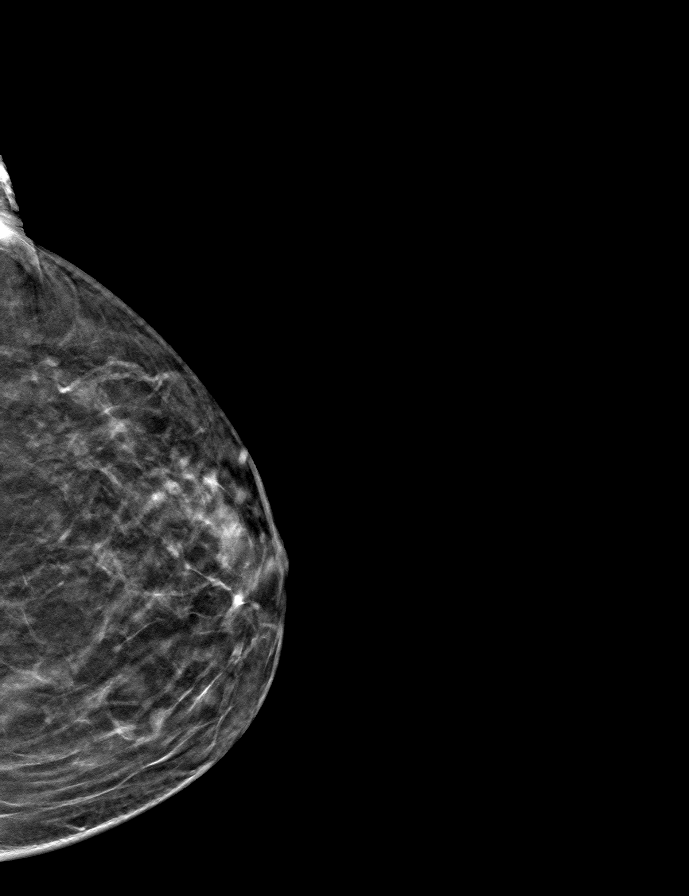

[R MLO tomo · tomo slice 27/54.0]
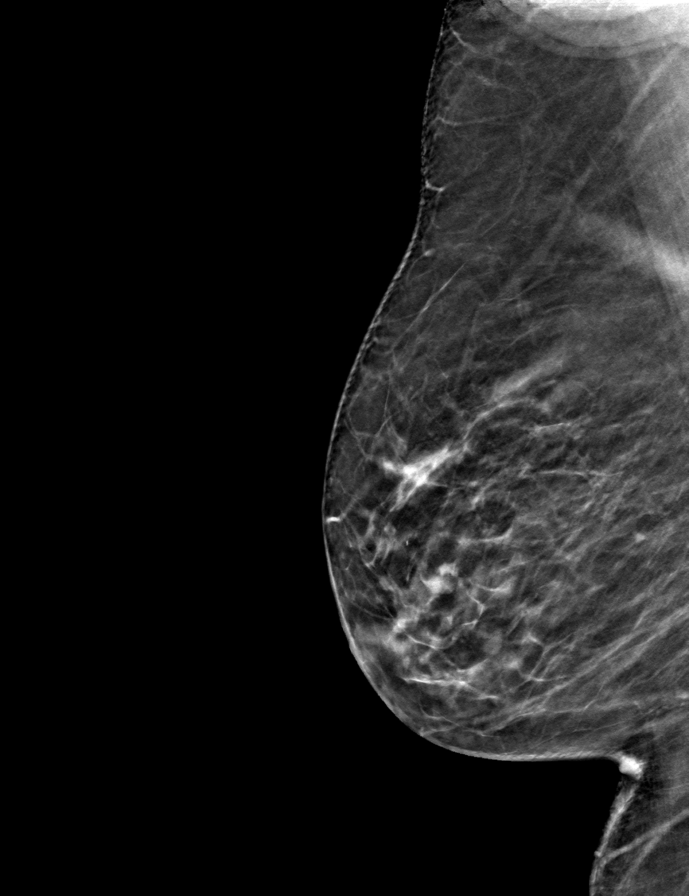

[L MLO tomo · tomo slice 25/50.0]
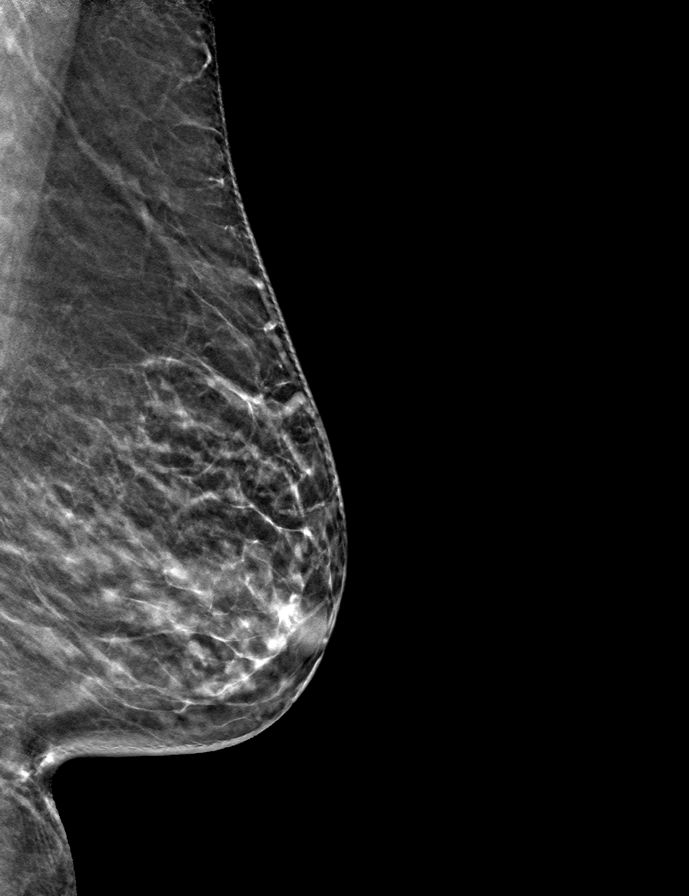

[R CC tomo · tomo slice 26/51.0]
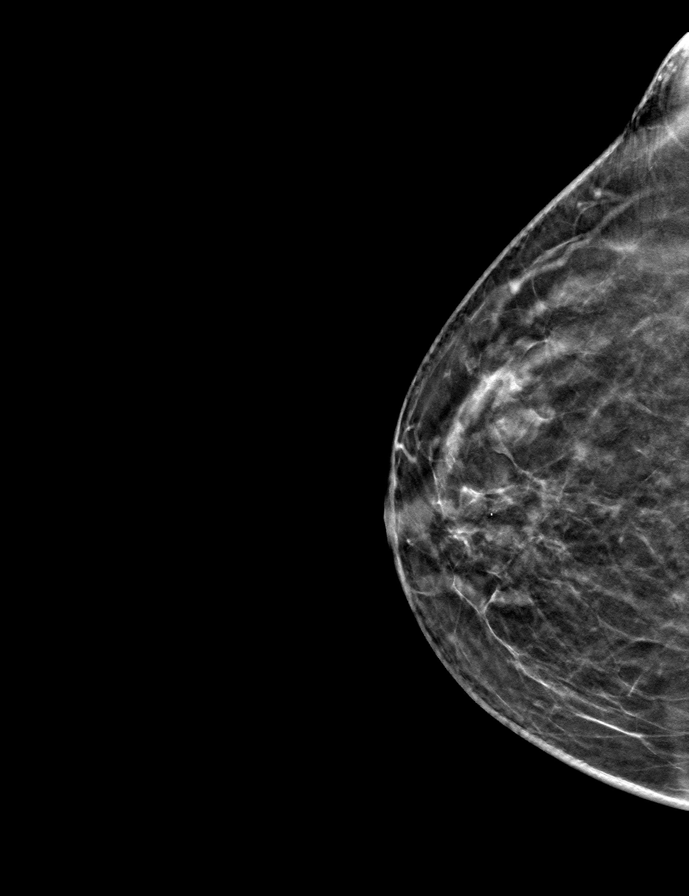

[9 of 24 positions shown; findings below may reference images not displayed]

ACR Breast Density Category b: There are scattered areas of
fibroglandular density.
FINDINGS: There are no findings suspicious for malignancy.
IMPRESSION: No mammographic evidence of malignancy. A result letter of this
screening mammogram will be mailed directly to the patient.

RECOMMENDATION:
Screening mammogram in one year. (Code:51-O-LD2)

BI-RADS CATEGORY  1: Negative.

## 2022-02-24 DIAGNOSIS — J069 Acute upper respiratory infection, unspecified: Secondary | ICD-10-CM | POA: Diagnosis not present

## 2022-02-24 DIAGNOSIS — R0981 Nasal congestion: Secondary | ICD-10-CM | POA: Diagnosis not present

## 2022-02-24 DIAGNOSIS — I119 Hypertensive heart disease without heart failure: Secondary | ICD-10-CM | POA: Diagnosis not present

## 2022-02-24 DIAGNOSIS — Z1152 Encounter for screening for COVID-19: Secondary | ICD-10-CM | POA: Diagnosis not present

## 2022-02-24 DIAGNOSIS — J101 Influenza due to other identified influenza virus with other respiratory manifestations: Secondary | ICD-10-CM | POA: Diagnosis not present

## 2022-02-24 DIAGNOSIS — G43909 Migraine, unspecified, not intractable, without status migrainosus: Secondary | ICD-10-CM | POA: Diagnosis not present

## 2022-02-24 DIAGNOSIS — J4520 Mild intermittent asthma, uncomplicated: Secondary | ICD-10-CM | POA: Diagnosis not present

## 2022-02-24 DIAGNOSIS — J302 Other seasonal allergic rhinitis: Secondary | ICD-10-CM | POA: Diagnosis not present

## 2022-02-24 DIAGNOSIS — R638 Other symptoms and signs concerning food and fluid intake: Secondary | ICD-10-CM | POA: Diagnosis not present

## 2022-02-25 ENCOUNTER — Other Ambulatory Visit: Payer: Self-pay | Admitting: Internal Medicine

## 2022-02-25 DIAGNOSIS — Z1231 Encounter for screening mammogram for malignant neoplasm of breast: Secondary | ICD-10-CM

## 2022-03-12 DIAGNOSIS — J302 Other seasonal allergic rhinitis: Secondary | ICD-10-CM | POA: Diagnosis not present

## 2022-03-12 DIAGNOSIS — K760 Fatty (change of) liver, not elsewhere classified: Secondary | ICD-10-CM | POA: Diagnosis not present

## 2022-03-12 DIAGNOSIS — D692 Other nonthrombocytopenic purpura: Secondary | ICD-10-CM | POA: Diagnosis not present

## 2022-03-12 DIAGNOSIS — F331 Major depressive disorder, recurrent, moderate: Secondary | ICD-10-CM | POA: Diagnosis not present

## 2022-03-12 DIAGNOSIS — I119 Hypertensive heart disease without heart failure: Secondary | ICD-10-CM | POA: Diagnosis not present

## 2022-03-12 DIAGNOSIS — E7800 Pure hypercholesterolemia, unspecified: Secondary | ICD-10-CM | POA: Diagnosis not present

## 2022-03-12 DIAGNOSIS — I2510 Atherosclerotic heart disease of native coronary artery without angina pectoris: Secondary | ICD-10-CM | POA: Diagnosis not present

## 2022-03-12 DIAGNOSIS — J4520 Mild intermittent asthma, uncomplicated: Secondary | ICD-10-CM | POA: Diagnosis not present

## 2022-03-12 DIAGNOSIS — I77819 Aortic ectasia, unspecified site: Secondary | ICD-10-CM | POA: Diagnosis not present

## 2022-04-20 ENCOUNTER — Ambulatory Visit
Admission: RE | Admit: 2022-04-20 | Discharge: 2022-04-20 | Disposition: A | Discharge status: Home / Self Care | Payer: Medicare PPO | Source: Ambulatory Visit | Attending: Internal Medicine | Admitting: Internal Medicine

## 2022-04-20 DIAGNOSIS — Z1231 Encounter for screening mammogram for malignant neoplasm of breast: Secondary | ICD-10-CM

## 2022-07-05 DIAGNOSIS — H349 Unspecified retinal vascular occlusion: Secondary | ICD-10-CM | POA: Diagnosis not present

## 2022-07-05 DIAGNOSIS — Z961 Presence of intraocular lens: Secondary | ICD-10-CM | POA: Diagnosis not present

## 2022-08-18 DIAGNOSIS — H349 Unspecified retinal vascular occlusion: Secondary | ICD-10-CM | POA: Diagnosis not present

## 2022-08-18 DIAGNOSIS — H34832 Tributary (branch) retinal vein occlusion, left eye, with macular edema: Secondary | ICD-10-CM | POA: Diagnosis not present

## 2022-09-20 DIAGNOSIS — E119 Type 2 diabetes mellitus without complications: Secondary | ICD-10-CM | POA: Diagnosis not present

## 2022-09-20 DIAGNOSIS — I119 Hypertensive heart disease without heart failure: Secondary | ICD-10-CM | POA: Diagnosis not present

## 2022-09-20 DIAGNOSIS — E781 Pure hyperglyceridemia: Secondary | ICD-10-CM | POA: Diagnosis not present

## 2022-09-20 DIAGNOSIS — R7302 Impaired glucose tolerance (oral): Secondary | ICD-10-CM | POA: Diagnosis not present

## 2022-09-20 DIAGNOSIS — E7800 Pure hypercholesterolemia, unspecified: Secondary | ICD-10-CM | POA: Diagnosis not present

## 2022-09-20 DIAGNOSIS — I2510 Atherosclerotic heart disease of native coronary artery without angina pectoris: Secondary | ICD-10-CM | POA: Diagnosis not present

## 2022-09-27 DIAGNOSIS — E669 Obesity, unspecified: Secondary | ICD-10-CM | POA: Diagnosis not present

## 2022-09-27 DIAGNOSIS — I10 Essential (primary) hypertension: Secondary | ICD-10-CM | POA: Diagnosis not present

## 2022-09-27 DIAGNOSIS — E119 Type 2 diabetes mellitus without complications: Secondary | ICD-10-CM | POA: Diagnosis not present

## 2022-09-27 DIAGNOSIS — D692 Other nonthrombocytopenic purpura: Secondary | ICD-10-CM | POA: Diagnosis not present

## 2022-09-27 DIAGNOSIS — Z1339 Encounter for screening examination for other mental health and behavioral disorders: Secondary | ICD-10-CM | POA: Diagnosis not present

## 2022-09-27 DIAGNOSIS — E7800 Pure hypercholesterolemia, unspecified: Secondary | ICD-10-CM | POA: Diagnosis not present

## 2022-09-27 DIAGNOSIS — F331 Major depressive disorder, recurrent, moderate: Secondary | ICD-10-CM | POA: Diagnosis not present

## 2022-09-27 DIAGNOSIS — Z1331 Encounter for screening for depression: Secondary | ICD-10-CM | POA: Diagnosis not present

## 2022-09-27 DIAGNOSIS — I2510 Atherosclerotic heart disease of native coronary artery without angina pectoris: Secondary | ICD-10-CM | POA: Diagnosis not present

## 2022-09-27 DIAGNOSIS — Z6830 Body mass index (BMI) 30.0-30.9, adult: Secondary | ICD-10-CM | POA: Diagnosis not present

## 2022-09-27 DIAGNOSIS — R82998 Other abnormal findings in urine: Secondary | ICD-10-CM | POA: Diagnosis not present

## 2023-03-04 DIAGNOSIS — H34832 Tributary (branch) retinal vein occlusion, left eye, with macular edema: Secondary | ICD-10-CM | POA: Diagnosis not present

## 2023-03-17 ENCOUNTER — Other Ambulatory Visit: Payer: Self-pay | Admitting: Internal Medicine

## 2023-03-17 DIAGNOSIS — Z1231 Encounter for screening mammogram for malignant neoplasm of breast: Secondary | ICD-10-CM

## 2023-03-29 DIAGNOSIS — H43813 Vitreous degeneration, bilateral: Secondary | ICD-10-CM | POA: Diagnosis not present

## 2023-03-29 DIAGNOSIS — H35363 Drusen (degenerative) of macula, bilateral: Secondary | ICD-10-CM | POA: Diagnosis not present

## 2023-03-29 DIAGNOSIS — H348322 Tributary (branch) retinal vein occlusion, left eye, stable: Secondary | ICD-10-CM | POA: Diagnosis not present

## 2023-03-31 DIAGNOSIS — J4520 Mild intermittent asthma, uncomplicated: Secondary | ICD-10-CM | POA: Diagnosis not present

## 2023-03-31 DIAGNOSIS — Z96652 Presence of left artificial knee joint: Secondary | ICD-10-CM | POA: Diagnosis not present

## 2023-03-31 DIAGNOSIS — E7800 Pure hypercholesterolemia, unspecified: Secondary | ICD-10-CM | POA: Diagnosis not present

## 2023-03-31 DIAGNOSIS — I10 Essential (primary) hypertension: Secondary | ICD-10-CM | POA: Diagnosis not present

## 2023-03-31 DIAGNOSIS — E119 Type 2 diabetes mellitus without complications: Secondary | ICD-10-CM | POA: Diagnosis not present

## 2023-03-31 DIAGNOSIS — E669 Obesity, unspecified: Secondary | ICD-10-CM | POA: Diagnosis not present

## 2023-03-31 DIAGNOSIS — I2510 Atherosclerotic heart disease of native coronary artery without angina pectoris: Secondary | ICD-10-CM | POA: Diagnosis not present

## 2023-03-31 DIAGNOSIS — F331 Major depressive disorder, recurrent, moderate: Secondary | ICD-10-CM | POA: Diagnosis not present

## 2023-04-21 ENCOUNTER — Ambulatory Visit: Payer: Medicare PPO

## 2023-04-28 ENCOUNTER — Ambulatory Visit
Admission: RE | Admit: 2023-04-28 | Discharge: 2023-04-28 | Disposition: A | Discharge status: Home / Self Care | Payer: Medicare PPO | Source: Ambulatory Visit | Attending: Internal Medicine | Admitting: Internal Medicine

## 2023-04-28 DIAGNOSIS — Z1231 Encounter for screening mammogram for malignant neoplasm of breast: Secondary | ICD-10-CM

## 2023-05-23 DIAGNOSIS — M25511 Pain in right shoulder: Secondary | ICD-10-CM | POA: Diagnosis not present

## 2023-05-23 DIAGNOSIS — M19011 Primary osteoarthritis, right shoulder: Secondary | ICD-10-CM | POA: Diagnosis not present

## 2023-05-23 DIAGNOSIS — M7581 Other shoulder lesions, right shoulder: Secondary | ICD-10-CM | POA: Diagnosis not present

## 2023-05-27 ENCOUNTER — Other Ambulatory Visit: Payer: Self-pay | Admitting: Surgery

## 2023-05-27 DIAGNOSIS — M7581 Other shoulder lesions, right shoulder: Secondary | ICD-10-CM

## 2023-05-27 DIAGNOSIS — M19011 Primary osteoarthritis, right shoulder: Secondary | ICD-10-CM

## 2023-06-01 ENCOUNTER — Ambulatory Visit
Admission: RE | Admit: 2023-06-01 | Discharge: 2023-06-01 | Disposition: A | Discharge status: Home / Self Care | Source: Ambulatory Visit | Attending: Surgery | Admitting: Surgery

## 2023-06-01 DIAGNOSIS — M2540 Effusion, unspecified joint: Secondary | ICD-10-CM | POA: Diagnosis not present

## 2023-06-01 DIAGNOSIS — M7581 Other shoulder lesions, right shoulder: Secondary | ICD-10-CM | POA: Diagnosis not present

## 2023-06-01 DIAGNOSIS — M19011 Primary osteoarthritis, right shoulder: Secondary | ICD-10-CM | POA: Diagnosis not present

## 2023-06-01 DIAGNOSIS — M25511 Pain in right shoulder: Secondary | ICD-10-CM | POA: Diagnosis not present

## 2023-06-03 ENCOUNTER — Other Ambulatory Visit: Payer: Self-pay

## 2023-06-03 ENCOUNTER — Encounter
Admission: RE | Admit: 2023-06-03 | Discharge: 2023-06-03 | Disposition: A | Discharge status: Home / Self Care | Source: Ambulatory Visit | Attending: Surgery | Admitting: Surgery

## 2023-06-03 VITALS — BP 89/66 | HR 109 | Resp 16 | Ht 67.0 in | Wt 190.0 lb

## 2023-06-03 DIAGNOSIS — Z01818 Encounter for other preprocedural examination: Secondary | ICD-10-CM | POA: Diagnosis not present

## 2023-06-03 DIAGNOSIS — Z01810 Encounter for preprocedural cardiovascular examination: Secondary | ICD-10-CM | POA: Diagnosis not present

## 2023-06-03 HISTORY — DX: Anxiety disorder, unspecified: F41.9

## 2023-06-03 HISTORY — DX: Basal cell carcinoma of skin, unspecified: C44.91

## 2023-06-03 LAB — COMPREHENSIVE METABOLIC PANEL WITH GFR
ALT: 31 U/L (ref 0–44)
AST: 45 U/L — ABNORMAL HIGH (ref 15–41)
Albumin: 3.9 g/dL (ref 3.5–5.0)
Alkaline Phosphatase: 84 U/L (ref 38–126)
Anion gap: 15 (ref 5–15)
BUN: 17 mg/dL (ref 8–23)
CO2: 23 mmol/L (ref 22–32)
Calcium: 9.2 mg/dL (ref 8.9–10.3)
Chloride: 94 mmol/L — ABNORMAL LOW (ref 98–111)
Creatinine, Ser: 0.69 mg/dL (ref 0.44–1.00)
GFR, Estimated: >60 mL/min (ref >60)
Glucose, Bld: 167 mg/dL — ABNORMAL HIGH (ref 70–99)
Potassium: 4.1 mmol/L (ref 3.5–5.1)
Sodium: 132 mmol/L — ABNORMAL LOW (ref 135–145)
Total Bilirubin: 0.7 mg/dL (ref 0.0–1.2)
Total Protein: 7.4 g/dL (ref 6.5–8.1)

## 2023-06-03 LAB — CBC WITH DIFFERENTIAL/PLATELET
Abs Immature Granulocytes: 0.03 10*3/uL (ref 0.00–0.07)
Basophils Absolute: 0 10*3/uL (ref 0.0–0.1)
Basophils Relative: 0 %
Eosinophils Absolute: 0.1 10*3/uL (ref 0.0–0.5)
Eosinophils Relative: 1 %
HCT: 41.7 % (ref 36.0–46.0)
Hemoglobin: 13.9 g/dL (ref 12.0–15.0)
Immature Granulocytes: 0 %
Lymphocytes Relative: 20 %
Lymphs Abs: 2 10*3/uL (ref 0.7–4.0)
MCH: 29.2 pg (ref 26.0–34.0)
MCHC: 33.3 g/dL (ref 30.0–36.0)
MCV: 87.6 fL (ref 80.0–100.0)
Monocytes Absolute: 0.9 10*3/uL (ref 0.1–1.0)
Monocytes Relative: 9 %
Neutro Abs: 6.9 10*3/uL (ref 1.7–7.7)
Neutrophils Relative %: 70 %
Platelets: 354 10*3/uL (ref 150–400)
RBC: 4.76 MIL/uL (ref 3.87–5.11)
RDW: 13.2 % (ref 11.5–15.5)
WBC: 9.9 10*3/uL (ref 4.0–10.5)
nRBC: 0 % (ref 0.0–0.2)

## 2023-06-03 LAB — SURGICAL PCR SCREEN
MRSA, PCR: NEGATIVE
Staphylococcus aureus: NEGATIVE

## 2023-06-03 LAB — URINALYSIS, ROUTINE W REFLEX MICROSCOPIC
Bacteria, UA: NONE SEEN
Bilirubin Urine: NEGATIVE
Glucose, UA: NEGATIVE mg/dL
Hgb urine dipstick: NEGATIVE
Ketones, ur: NEGATIVE mg/dL
Nitrite: NEGATIVE
Protein, ur: 30 mg/dL — AB
Specific Gravity, Urine: 1.023 (ref 1.005–1.030)
pH: 6 (ref 5.0–8.0)

## 2023-06-03 NOTE — Patient Instructions (Addendum)
 Your procedure is scheduled on: THURSDAY 06/09/23 To find out your arrival time, please call [phone] between 1PM - 3PM on: [facility] 06/08/23   Report to the Registration Desk on the 1st floor of the [facility]. FREE Valet parking is available.  If your arrival time is 6:00 am, do not arrive before that time as the [facility] entrance doors do not open until 6:00 am.  REMEMBER: Instructions that are not followed completely may result in serious medical risk, up to and including death; or upon the discretion of your surgeon and anesthesiologist your surgery may need to be rescheduled.  Do not eat food after midnight the night before surgery.  No gum chewing or hard candies.  You may however, drink CLEAR liquids up to 2 hours before you are scheduled to arrive for your surgery. Do not drink anything within 2 hours of your scheduled arrival time.  Clear liquids include: - water  - apple juice without pulp - gatorade (not RED colors) - black coffee or tea (Do NOT add milk or creamers to the coffee or tea) Do NOT drink anything that is not on this list.  In addition, your doctor has ordered for you to drink the provided:  Ensure Pre-Surgery Clear Carbohydrate Drink  Drinking this carbohydrate drink up to two hours before surgery helps to reduce insulin resistance and improve patient outcomes. Please complete drinking 2 hours before scheduled arrival time.  One week prior to surgery: Stop Anti-inflammatories (NSAIDS) such as Advil, Aleve, Ibuprofen, Motrin, Naproxen, Naprosyn and Aspirin based products such as Excedrin, Goody's Powder, BC Powder. You may however, continue to take Tylenol  if needed for pain up until the day of surgery.  Stop ANY OVER THE COUNTER supplements and vitamins until after surgery. Calcium /Vit D, B12, Melatonin  Continue taking all prescribed medications.   TAKE ONLY THESE MEDICATIONS THE MORNING OF SURGERY WITH A SIP OF WATER:  cetirizine (ZYRTEC) 10  MG tablet  esomeprazole  (NEXIUM ) 40 MG capsule Antacid (take one the night before and one on the morning of surgery - helps to prevent nausea after surgery.)  Use inhalers on the day of surgery and bring to the hospital.  No Alcohol  for 24 hours before or after surgery.  No Smoking including e-cigarettes for 24 hours before surgery.  No chewable tobacco products for at least 6 hours before surgery.  No nicotine patches on the day of surgery.  Do not use any recreational drugs for at least a week (preferably 2 weeks) before your surgery.  Please be advised that the combination of cocaine and anesthesia may have negative outcomes, up to and including death. If you test positive for cocaine, your surgery will be cancelled.  On the morning of surgery brush your teeth with toothpaste and water, you may rinse your mouth with mouthwash if you wish. Do not swallow any toothpaste or mouthwash.  Use CHG Soap or wipes as directed on instruction sheet.  Do not wear lotions, powders, or perfumes. NO DEODORANT  Do not shave body hair from the neck down STARTING sUNDAY  Wear comfortable clothing (specific to your surgery type) to the hospital.  Do not wear jewelry, make-up, hairpins, clips or nail polish.  For welded (permanent) jewelry: bracelets, anklets, waist bands, etc.  Please have this removed prior to surgery.  If it is not removed, there is a chance that hospital personnel will need to cut it off on the day of surgery. Contact lenses, hearing aids and dentures may not  be worn into surgery.  Do not bring valuables to the hospital. [facility] is not responsible for any missing/lost belongings or valuables.   Total Shoulder Arthroplasty:  use Benzoyl Peroxide 5% Gel as directed on instruction sheet.  Notify your doctor if there is any change in your medical condition (cold, fever, infection).  If you are being discharged the day of surgery, you will not be allowed to drive home. You  will need a responsible individual to drive you home and stay with you for 24 hours after surgery.   If you are taking public transportation, you will need to have a responsible individual with you.  After surgery, you can help prevent lung complications by doing breathing exercises.  Take deep breaths and cough every 1-2 hours. Your doctor may order a device called an Incentive Spirometer to help you take deep breaths.  Surgery Visitation Policy:  Patients undergoing a surgery or procedure may have two family members or support persons with them as long as the person is not COVID-19 positive or experiencing its symptoms.   Please call the Pre-admissions Testing Dept. at [phone] if you have any questions about these instructions.    Pre-operative 5 CHG Bath Instructions   You can play a key role in reducing the risk of infection after surgery. Your skin needs to be as free of germs as possible. You can reduce the number of germs on your skin by washing with CHG (chlorhexidine  gluconate) soap before surgery. CHG is an antiseptic soap that kills germs and continues to kill germs even after washing.   DO NOT use if you have an allergy to chlorhexidine /CHG or antibacterial soaps. If your skin becomes reddened or irritated, stop using the CHG and notify one of our RNs at [phone].   Please shower with the CHG soap starting 4 days before surgery using the following schedule:   SUNDAY 06/05/23 - THURSDAY 06/09/23    Please keep in mind the following:  DO NOT shave, including legs and underarms, starting the day of your first shower.   You may shave your face at any point before/day of surgery.  Place clean sheets on your bed the day you start using CHG soap. Use a clean washcloth (not used since being washed) for each shower. DO NOT sleep with pets once you start using the CHG.   CHG Shower Instructions:  If you choose to wash your hair and private area, wash first with your  normal shampoo/soap.  After you use shampoo/soap, rinse your hair and body thoroughly to remove shampoo/soap residue.  Turn the water OFF and apply about 3 tablespoons (45 ml) of CHG soap to a CLEAN washcloth.  Apply CHG soap ONLY FROM YOUR NECK DOWN TO YOUR TOES (washing for 3-5 minutes)  DO NOT use CHG soap on face, private areas, open wounds, or sores.  Pay special attention to the area where your surgery is being performed.  If you are having back surgery, having someone wash your back for you may be helpful. Wait 2 minutes after CHG soap is applied, then you may rinse off the CHG soap.  Pat dry with a clean towel  Put on clean clothes/pajamas   If you choose to wear lotion, please use ONLY the CHG-compatible lotions on the back of this paper the first 4 days only. NO LOTION ON THE DAY OF SURGERY    Additional instructions for the day of surgery: DO NOT APPLY any lotions, deodorants, cologne, or perfumes.  Put on clean/comfortable clothes.  Brush your teeth.  Ask your nurse before applying any prescription medications to the skin.     CHG Compatible Lotions   Aveeno Moisturizing lotion  Cetaphil Moisturizing Cream  Cetaphil Moisturizing Lotion  Clairol Herbal Essence Moisturizing Lotion, Dry Skin  Clairol Herbal Essence Moisturizing Lotion, Extra Dry Skin  Clairol Herbal Essence Moisturizing Lotion, Normal Skin  Curel Age Defying Therapeutic Moisturizing Lotion with Alpha Hydroxy  Curel Extreme Care Body Lotion  Curel Soothing Hands Moisturizing Hand Lotion  Curel Therapeutic Moisturizing Cream, Fragrance-Free  Curel Therapeutic Moisturizing Lotion, Fragrance-Free  Curel Therapeutic Moisturizing Lotion, Original Formula  Eucerin Daily Replenishing Lotion  Eucerin Dry Skin Therapy Plus Alpha Hydroxy Crme  Eucerin Dry Skin Therapy Plus Alpha Hydroxy Lotion  Eucerin Original Crme  Eucerin Original Lotion  Eucerin Plus Crme Eucerin Plus Lotion  Eucerin TriLipid  Replenishing Lotion  Keri Anti-Bacterial Hand Lotion  Keri Deep Conditioning Original Lotion Dry Skin Formula Softly Scented  Keri Deep Conditioning Original Lotion, Fragrance Free Sensitive Skin Formula  Keri Lotion Fast Absorbing Fragrance Free Sensitive Skin Formula  Keri Lotion Fast Absorbing Softly Scented Dry Skin Formula  Keri Original Lotion  Keri Skin Renewal Lotion Keri Silky Smooth Lotion  Keri Silky Smooth Sensitive Skin Lotion  Nivea Body Creamy Conditioning Oil  Nivea Body Extra Enriched Lotion  Nivea Body Original Lotion  Nivea Body Sheer Moisturizing Lotion Nivea Crme  Nivea Skin Firming Lotion  NutraDerm 30 Skin Lotion  NutraDerm Skin Lotion  NutraDerm Therapeutic Skin Cream  NutraDerm Therapeutic Skin Lotion  ProShield Protective Hand Cream  Provon moisturizing lotion  Preparing for Total Shoulder Arthroplasty  Before surgery, you can play an important role by reducing the number of germs on your skin by using the following products:  Benzoyl Peroxide Gel  o Reduces the number of germs present on the skin  o Applied twice a day to shoulder area starting two days before surgery  Chlorhexidine  Gluconate (CHG) Soap  o An antiseptic cleaner that kills germs and bonds with the skin to continue killing germs even after washing  o Used for showering the night before surgery and morning of surgery  BENZOYL PEROXIDE 5% GEL  Please do not use if you have an allergy to benzoyl peroxide. If your skin becomes reddened/irritated stop using the benzoyl peroxide.  Starting two days before surgery, apply as follows:  1. Apply benzoyl peroxide in the morning and at night. Apply after taking a shower. If you are not taking a shower, clean entire shoulder front, back, and side along with the armpit with a clean wet washcloth.  2. Place a quarter-sized dollop on your shoulder and rub in thoroughly, making sure to cover the front, back, and side of your shoulder, along with  the armpit.  2 days before ____ AM ____ PM (Tues 06/07/23) 1 day before  ____  AM ____ PM (Wed 06/08/23)  3. Do this twice a day for two days. (Last application is the night before surgery, AFTER using the CHG soap).  4. Do NOT apply benzoyl peroxide gel on the day of surgery.    How to Use an Incentive Spirometer  An incentive spirometer is a tool that measures how well you are filling your lungs with each breath. Learning to take long, deep breaths using this tool can help you keep your lungs clear and active. This may help to reverse or lessen your chance of developing breathing (pulmonary) problems, especially infection. You may be asked  to use a spirometer: After a surgery. If you have a lung problem or a history of smoking. After a long period of time when you have been unable to move or be active. If the spirometer includes an indicator to show the highest number that you have reached, your health care provider or respiratory therapist will help you set a goal. Keep a log of your progress as told by your health care provider. What are the risks? Breathing too quickly may cause dizziness or cause you to pass out. Take your time so you do not get dizzy or light-headed. If you are in pain, you may need to take pain medicine before doing incentive spirometry. It is harder to take a deep breath if you are having pain. How to use your incentive spirometer  Sit up on the edge of your bed or on a chair. Hold the incentive spirometer so that it is in an upright position. Before you use the spirometer, breathe out normally. Place the mouthpiece in your mouth. Make sure your lips are closed tightly around it. Breathe in slowly and as deeply as you can through your mouth, causing the piston or the ball to rise toward the top of the chamber. Hold your breath for 3-5 seconds, or for as long as possible. If the spirometer includes a coach indicator, use this to guide you in breathing. Slow down  your breathing if the indicator goes above the marked areas. Remove the mouthpiece from your mouth and breathe out normally. The piston or ball will return to the bottom of the chamber. Rest for a few seconds, then repeat the steps 10 or more times. Take your time and take a few normal breaths between deep breaths so that you do not get dizzy or light-headed. Do this every 1-2 hours when you are awake. If the spirometer includes a goal marker to show the highest number you have reached (best effort), use this as a goal to work toward during each repetition. After each set of 10 deep breaths, cough a few times. This will help to make sure that your lungs are clear. If you have an incision on your chest or abdomen from surgery, place a pillow or a rolled-up towel firmly against the incision when you cough. This can help to reduce pain while taking deep breaths and coughing. General tips When you are able to get out of bed: Walk around often. Continue to take deep breaths and cough in order to clear your lungs. Keep using the incentive spirometer until your health care provider says it is okay to stop using it. If you have been in the hospital, you may be told to keep using the spirometer at home. Contact a health care provider if: You are having difficulty using the spirometer. You have trouble using the spirometer as often as instructed. Your pain medicine is not giving enough relief for you to use the spirometer as told. You have a fever. Get help right away if: You develop shortness of breath. You develop a cough with bloody mucus from the lungs. You have fluid or blood coming from an incision site after you cough. Summary An incentive spirometer is a tool that can help you learn to take long, deep breaths to keep your lungs clear and active. You may be asked to use a spirometer after a surgery, if you have a lung problem or a history of smoking, or if you have been inactive for a long  period of  time. Use your incentive spirometer as instructed every 1-2 hours while you are awake. If you have an incision on your chest or abdomen, place a pillow or a rolled-up towel firmly against your incision when you cough. This will help to reduce pain. Get help right away if you have shortness of breath, you cough up bloody mucus, or blood comes from your incision when you cough. This information is not intended to replace advice given to you by your health care provider. Make sure you discuss any questions you have with your health care provider. Document Revised: 04/23/2019 Document Reviewed: 04/23/2019 Elsevier Patient Education  2023 [employer].

## 2023-06-03 NOTE — Pre-Procedure Instructions (Signed)
 Allegra Isles NP aware of pt's BP at PAT visit. Awaiting labs.

## 2023-06-09 ENCOUNTER — Encounter: Payer: Self-pay | Admitting: Surgery

## 2023-06-09 ENCOUNTER — Ambulatory Visit

## 2023-06-09 ENCOUNTER — Other Ambulatory Visit: Payer: Self-pay

## 2023-06-09 ENCOUNTER — Ambulatory Visit: Admitting: Anesthesiology

## 2023-06-09 ENCOUNTER — Ambulatory Visit: Payer: Self-pay | Admitting: Urgent Care

## 2023-06-09 ENCOUNTER — Ambulatory Visit
Admission: RE | Admit: 2023-06-09 | Discharge: 2023-06-09 | Disposition: A | Discharge status: Home / Self Care | Attending: Surgery | Admitting: Surgery

## 2023-06-09 ENCOUNTER — Encounter
Admission: RE | Disposition: A | Discharge status: Home / Self Care | Payer: Self-pay | Source: Home / Self Care | Attending: Surgery

## 2023-06-09 DIAGNOSIS — K219 Gastro-esophageal reflux disease without esophagitis: Secondary | ICD-10-CM | POA: Diagnosis not present

## 2023-06-09 DIAGNOSIS — M7521 Bicipital tendinitis, right shoulder: Secondary | ICD-10-CM | POA: Insufficient documentation

## 2023-06-09 DIAGNOSIS — F419 Anxiety disorder, unspecified: Secondary | ICD-10-CM | POA: Diagnosis not present

## 2023-06-09 DIAGNOSIS — I10 Essential (primary) hypertension: Secondary | ICD-10-CM | POA: Insufficient documentation

## 2023-06-09 DIAGNOSIS — M85811 Other specified disorders of bone density and structure, right shoulder: Secondary | ICD-10-CM | POA: Diagnosis not present

## 2023-06-09 DIAGNOSIS — Z96611 Presence of right artificial shoulder joint: Secondary | ICD-10-CM | POA: Diagnosis not present

## 2023-06-09 DIAGNOSIS — G8918 Other acute postprocedural pain: Secondary | ICD-10-CM | POA: Diagnosis not present

## 2023-06-09 DIAGNOSIS — M19011 Primary osteoarthritis, right shoulder: Secondary | ICD-10-CM | POA: Insufficient documentation

## 2023-06-09 DIAGNOSIS — Z471 Aftercare following joint replacement surgery: Secondary | ICD-10-CM | POA: Diagnosis not present

## 2023-06-09 HISTORY — PX: REVERSE SHOULDER ARTHROPLASTY: SHX5054

## 2023-06-09 SURGERY — ARTHROPLASTY, SHOULDER, TOTAL, REVERSE
Anesthesia: General | Site: Shoulder | Laterality: Right

## 2023-06-09 MED ORDER — SODIUM CHLORIDE 0.9 % IV SOLN: INTRAVENOUS | Status: DC

## 2023-06-09 MED ORDER — BUPIVACAINE LIPOSOME 1.3 % IJ SUSP
INTRAMUSCULAR | Status: DC | PRN
  Administered 2023-06-09: 20 mL via PERINEURAL

## 2023-06-09 MED ORDER — OXYCODONE HCL 5 MG PO TABS: 5.0000 mg | ORAL_TABLET | ORAL | 0 refills | Status: AC | PRN

## 2023-06-09 MED ORDER — MIDAZOLAM HCL 2 MG/2ML IJ SOLN
INTRAMUSCULAR | Status: AC
  Filled 2023-06-09: qty 2

## 2023-06-09 MED ORDER — ACETAMINOPHEN 325 MG PO TABS: 325.0000 mg | ORAL_TABLET | Freq: Four times a day (QID) | ORAL | Status: DC | PRN

## 2023-06-09 MED ORDER — METOCLOPRAMIDE HCL 5 MG/ML IJ SOLN: 5.0000 mg | Freq: Three times a day (TID) | INTRAMUSCULAR | Status: DC | PRN

## 2023-06-09 MED ORDER — BUPIVACAINE HCL (PF) 0.5 % IJ SOLN
INTRAMUSCULAR | Status: DC | PRN
  Administered 2023-06-09: 10 mL via PERINEURAL

## 2023-06-09 MED ORDER — OXYCODONE HCL 5 MG PO TABS: 5.0000 mg | ORAL_TABLET | Freq: Once | ORAL | Status: DC | PRN

## 2023-06-09 MED ORDER — KETOROLAC TROMETHAMINE 15 MG/ML IJ SOLN
INTRAMUSCULAR | Status: AC
  Filled 2023-06-09: qty 1

## 2023-06-09 MED ORDER — DEXAMETHASONE SODIUM PHOSPHATE 10 MG/ML IJ SOLN
INTRAMUSCULAR | Status: DC | PRN
Start: 2023-06-09 — End: 2023-06-09
  Administered 2023-06-09: 10 mg via INTRAVENOUS

## 2023-06-09 MED ORDER — FENTANYL CITRATE PF 50 MCG/ML IJ SOSY
50.0000 ug | PREFILLED_SYRINGE | Freq: Once | INTRAMUSCULAR | Status: AC
  Administered 2023-06-09: 50 ug via INTRAVENOUS

## 2023-06-09 MED ORDER — SODIUM CHLORIDE 0.9 % IR SOLN
Status: DC | PRN
  Administered 2023-06-09: 3000 mL

## 2023-06-09 MED ORDER — BUPIVACAINE HCL (PF) 0.5 % IJ SOLN
INTRAMUSCULAR | Status: AC
  Filled 2023-06-09: qty 10

## 2023-06-09 MED ORDER — KETOROLAC TROMETHAMINE 15 MG/ML IJ SOLN
15.0000 mg | Freq: Once | INTRAMUSCULAR | Status: AC
  Administered 2023-06-09: 15 mg via INTRAVENOUS

## 2023-06-09 MED ORDER — CEFAZOLIN SODIUM-DEXTROSE 2-4 GM/100ML-% IV SOLN
INTRAVENOUS | Status: AC
  Filled 2023-06-09: qty 100

## 2023-06-09 MED ORDER — EPHEDRINE 5 MG/ML INJ
INTRAVENOUS | Status: AC
  Filled 2023-06-09: qty 5

## 2023-06-09 MED ORDER — EPHEDRINE SULFATE (PRESSORS) 50 MG/ML IJ SOLN
INTRAMUSCULAR | Status: DC | PRN
  Administered 2023-06-09: 10 mg via INTRAVENOUS

## 2023-06-09 MED ORDER — FENTANYL CITRATE (PF) 100 MCG/2ML IJ SOLN: 25.0000 ug | INTRAMUSCULAR | Status: DC | PRN

## 2023-06-09 MED ORDER — LIDOCAINE HCL (CARDIAC) PF 100 MG/5ML IV SOSY
PREFILLED_SYRINGE | INTRAVENOUS | Status: DC | PRN
  Administered 2023-06-09 (×2): 100 mg via INTRAVENOUS

## 2023-06-09 MED ORDER — ONDANSETRON HCL 4 MG/2ML IJ SOLN: 4.0000 mg | Freq: Four times a day (QID) | INTRAMUSCULAR | Status: DC | PRN

## 2023-06-09 MED ORDER — CHLORHEXIDINE GLUCONATE 0.12 % MT SOLN
15.0000 mL | Freq: Once | OROMUCOSAL | Status: AC
  Administered 2023-06-09: 15 mL via OROMUCOSAL

## 2023-06-09 MED ORDER — LACTATED RINGERS IV SOLN: INTRAVENOUS | Status: DC

## 2023-06-09 MED ORDER — ONDANSETRON HCL 4 MG PO TABS: 4.0000 mg | ORAL_TABLET | Freq: Four times a day (QID) | ORAL | Status: DC | PRN

## 2023-06-09 MED ORDER — ONDANSETRON HCL 4 MG/2ML IJ SOLN
INTRAMUSCULAR | Status: DC | PRN
  Administered 2023-06-09: 4 mg via INTRAVENOUS

## 2023-06-09 MED ORDER — PHENYLEPHRINE HCL-NACL 20-0.9 MG/250ML-% IV SOLN
INTRAVENOUS | Status: DC | PRN
  Administered 2023-06-09: 30 ug/min via INTRAVENOUS

## 2023-06-09 MED ORDER — OXYCODONE HCL 5 MG/5ML PO SOLN: 5.0000 mg | Freq: Once | ORAL | Status: DC | PRN

## 2023-06-09 MED ORDER — ACETAMINOPHEN 500 MG PO TABS
ORAL_TABLET | ORAL | Status: AC
  Filled 2023-06-09: qty 2

## 2023-06-09 MED ORDER — KETAMINE HCL 50 MG/5ML IJ SOSY
PREFILLED_SYRINGE | INTRAMUSCULAR | Status: AC
  Filled 2023-06-09: qty 5

## 2023-06-09 MED ORDER — PROPOFOL 1000 MG/100ML IV EMUL
INTRAVENOUS | Status: AC
  Filled 2023-06-09: qty 100

## 2023-06-09 MED ORDER — ACETAMINOPHEN 10 MG/ML IV SOLN: 1000.0000 mg | Freq: Once | INTRAVENOUS | Status: DC | PRN

## 2023-06-09 MED ORDER — PROPOFOL 10 MG/ML IV BOLUS
INTRAVENOUS | Status: DC | PRN
  Administered 2023-06-09: 100 mg via INTRAVENOUS

## 2023-06-09 MED ORDER — CEFAZOLIN SODIUM-DEXTROSE 2-4 GM/100ML-% IV SOLN
2.0000 g | INTRAVENOUS | Status: AC
Start: 2023-06-09 — End: 2023-06-09
  Administered 2023-06-09: 2 g via INTRAVENOUS

## 2023-06-09 MED ORDER — PHENYLEPHRINE HCL-NACL 20-0.9 MG/250ML-% IV SOLN
INTRAVENOUS | Status: AC
  Filled 2023-06-09: qty 250

## 2023-06-09 MED ORDER — SUGAMMADEX SODIUM 200 MG/2ML IV SOLN
INTRAVENOUS | Status: DC | PRN
  Administered 2023-06-09: 200 mg via INTRAVENOUS

## 2023-06-09 MED ORDER — BUPIVACAINE-EPINEPHRINE (PF) 0.5% -1:200000 IJ SOLN
INTRAMUSCULAR | Status: AC
Start: 2023-06-09 — End: ?
  Filled 2023-06-09: qty 30

## 2023-06-09 MED ORDER — BUPIVACAINE-EPINEPHRINE (PF) 0.5% -1:200000 IJ SOLN
INTRAMUSCULAR | Status: DC | PRN
  Administered 2023-06-09: 30 mL

## 2023-06-09 MED ORDER — ROCURONIUM BROMIDE 100 MG/10ML IV SOLN
INTRAVENOUS | Status: DC | PRN
  Administered 2023-06-09: 50 mg via INTRAVENOUS
  Administered 2023-06-09: 20 mg via INTRAVENOUS

## 2023-06-09 MED ORDER — OXYCODONE HCL 5 MG PO TABS: 5.0000 mg | ORAL_TABLET | ORAL | Status: DC | PRN

## 2023-06-09 MED ORDER — MIDAZOLAM HCL 2 MG/2ML IJ SOLN
1.0000 mg | INTRAMUSCULAR | Status: AC | PRN
  Administered 2023-06-09 (×2): 1 mg via INTRAVENOUS

## 2023-06-09 MED ORDER — CEFAZOLIN SODIUM-DEXTROSE 2-4 GM/100ML-% IV SOLN
2.0000 g | Freq: Four times a day (QID) | INTRAVENOUS | Status: DC
  Administered 2023-06-09: 2 g via INTRAVENOUS

## 2023-06-09 MED ORDER — TRANEXAMIC ACID-NACL 1000-0.7 MG/100ML-% IV SOLN
1000.0000 mg | INTRAVENOUS | Status: AC
  Administered 2023-06-09: 1000 mg via INTRAVENOUS

## 2023-06-09 MED ORDER — ACETAMINOPHEN 500 MG PO TABS
1000.0000 mg | ORAL_TABLET | Freq: Once | ORAL | Status: AC
  Administered 2023-06-09: 1000 mg via ORAL

## 2023-06-09 MED ORDER — DEXMEDETOMIDINE HCL IN NACL 80 MCG/20ML IV SOLN
INTRAVENOUS | Status: DC | PRN
  Administered 2023-06-09: 8 ug via INTRAVENOUS
  Administered 2023-06-09: 12 ug via INTRAVENOUS

## 2023-06-09 MED ORDER — METOCLOPRAMIDE HCL 10 MG PO TABS: 5.0000 mg | ORAL_TABLET | Freq: Three times a day (TID) | ORAL | Status: DC | PRN

## 2023-06-09 MED ORDER — CHLORHEXIDINE GLUCONATE 0.12 % MT SOLN
OROMUCOSAL | Status: AC
  Filled 2023-06-09: qty 15

## 2023-06-09 MED ORDER — BUPIVACAINE LIPOSOME 1.3 % IJ SUSP
INTRAMUSCULAR | Status: AC
  Filled 2023-06-09: qty 20

## 2023-06-09 MED ORDER — TRANEXAMIC ACID-NACL 1000-0.7 MG/100ML-% IV SOLN
INTRAVENOUS | Status: AC
  Filled 2023-06-09: qty 100

## 2023-06-09 MED ORDER — KETAMINE HCL 50 MG/5ML IJ SOSY
PREFILLED_SYRINGE | INTRAMUSCULAR | Status: DC | PRN
  Administered 2023-06-09: 20 mg via INTRAVENOUS
  Administered 2023-06-09: 10 mg via INTRAVENOUS
  Administered 2023-06-09: 20 mg via INTRAVENOUS

## 2023-06-09 MED ORDER — DROPERIDOL 2.5 MG/ML IJ SOLN: 0.6250 mg | Freq: Once | INTRAMUSCULAR | Status: DC | PRN

## 2023-06-09 MED ORDER — FENTANYL CITRATE PF 50 MCG/ML IJ SOSY
PREFILLED_SYRINGE | INTRAMUSCULAR | Status: AC
  Filled 2023-06-09: qty 1

## 2023-06-09 MED ORDER — ORAL CARE MOUTH RINSE: 15.0000 mL | Freq: Once | OROMUCOSAL | Status: AC

## 2023-06-09 SURGICAL SUPPLY — 67 items
BASEPLATE AUG MED W-TAPER (Plate) IMPLANT
BASEPLATE BOSS DRILL (MISCELLANEOUS) IMPLANT
BIT DRILL 2.7 W/STOP DISP (BIT) IMPLANT
BIT DRILL BASEPLATE CENTRAL S (BIT) IMPLANT
BIT DRILL F/BASEPLATE CENTRAL (BIT) IMPLANT
BIT DRILL TWIST 2.7 (BIT) IMPLANT
BLADE SAW SAG 25X90X1.19 (BLADE) ×1 IMPLANT
CHLORAPREP W/TINT 26 (MISCELLANEOUS) ×1 IMPLANT
COMP HUM UNI IDENTI -6 EXT (Joint) IMPLANT
COMP HUM UNI IDENTI 36 +0 (Joint) IMPLANT
COOLER POLAR GLACIER W/PUMP (MISCELLANEOUS) ×1 IMPLANT
DRAPE INCISE IOBAN 66X45 STRL (DRAPES) ×1 IMPLANT
DRAPE SHEET LG 3/4 BI-LAMINATE (DRAPES) ×1 IMPLANT
DRAPE TABLE BACK 80X90 (DRAPES) ×1 IMPLANT
DRSG OPSITE POSTOP 4X8 (GAUZE/BANDAGES/DRESSINGS) ×1 IMPLANT
ELECT CAUTERY BLADE 6.4 (BLADE) ×1 IMPLANT
ELECTRODE BLDE 4.0 EZ CLN MEGD (MISCELLANEOUS) ×1 IMPLANT
ELECTRODE REM PT RTRN 9FT ADLT (ELECTROSURGICAL) ×1 IMPLANT
GAUZE XEROFORM 1X8 LF (GAUZE/BANDAGES/DRESSINGS) ×1 IMPLANT
GLENOID SPHERE 36MM CVD +3 (Orthopedic Implant) IMPLANT
GLOVE BIO SURGEON STRL SZ7.5 (GLOVE) ×4 IMPLANT
GLOVE BIO SURGEON STRL SZ8 (GLOVE) ×4 IMPLANT
GLOVE BIOGEL PI IND STRL 8 (GLOVE) ×2 IMPLANT
GLOVE INDICATOR 8.0 STRL GRN (GLOVE) ×1 IMPLANT
GOWN STRL REUS W/ TWL LRG LVL3 (GOWN DISPOSABLE) ×2 IMPLANT
GOWN STRL REUS W/ TWL XL LVL3 (GOWN DISPOSABLE) ×1 IMPLANT
GUIDE PIN 2.0 X 150 (WIRE) IMPLANT
HANDLE YANKAUER SUCT OPEN TIP (MISCELLANEOUS) ×1 IMPLANT
HOOD PEEL AWAY T7 (MISCELLANEOUS) ×3 IMPLANT
KIT STABILIZATION SHOULDER (MISCELLANEOUS) ×1 IMPLANT
KIT TURNOVER KIT A (KITS) ×1 IMPLANT
MANIFOLD NEPTUNE II (INSTRUMENTS) ×1 IMPLANT
MASK FACE SPIDER DISP (MASK) ×1 IMPLANT
MAT ABSORB FLUID 56X50 GRAY (MISCELLANEOUS) ×1 IMPLANT
NDL MAYO CATGUT SZ1 (NEEDLE) IMPLANT
NDL SAFETY ECLIPSE 18X1.5 (NEEDLE) ×1 IMPLANT
NDL SPNL 20GX3.5 QUINCKE YW (NEEDLE) ×1 IMPLANT
NEEDLE MAYO CATGUT SZ1 (NEEDLE) IMPLANT
NEEDLE SPNL 20GX3.5 QUINCKE YW (NEEDLE) ×1 IMPLANT
PACK ARTHROSCOPY SHOULDER (MISCELLANEOUS) ×1 IMPLANT
PAD ARMBOARD POSITIONER FOAM (MISCELLANEOUS) ×1 IMPLANT
PAD WRAPON POLAR SHDR UNIV (MISCELLANEOUS) ×1 IMPLANT
PENCIL SMOKE EVACUATOR (MISCELLANEOUS) IMPLANT
PIN THREADED REVERSE (PIN) IMPLANT
REAMER GUIDE BUSHING SURG DISP (MISCELLANEOUS) IMPLANT
REAMER GUIDE W/SCREW AUG (MISCELLANEOUS) IMPLANT
SCREW BONE LOCKING 4.75X30X3.5 (Screw) IMPLANT
SCREW BONE LOCKING 4.75X35X3.5 (Screw) IMPLANT
SCREW BONE STRL 6.5MMX25MM (Screw) IMPLANT
SCREW LOCKING 4.75MMX15MM (Screw) IMPLANT
SCREW LOCKING NS 4.75MMX20MM (Screw) IMPLANT
SLING ULTRA II LG (MISCELLANEOUS) IMPLANT
SLING ULTRA II M (MISCELLANEOUS) IMPLANT
SOL .9 NS 3000ML IRR UROMATIC (IV SOLUTION) ×1 IMPLANT
SPONGE T-LAP 18X18 ~~LOC~~+RFID (SPONGE) ×2 IMPLANT
STAPLER SKIN PROX 35W (STAPLE) ×1 IMPLANT
STEM HUMERAL MICRO SZ7 (Stem) IMPLANT
SUT ETHIBOND 0 MO6 C/R (SUTURE) ×1 IMPLANT
SUT VIC AB 0 CT1 36 (SUTURE) ×1 IMPLANT
SUT VIC AB 2-0 CT1 TAPERPNT 27 (SUTURE) ×2 IMPLANT
SUTURE FIBERWR #2 38 BLUE 1/2 (SUTURE) ×4 IMPLANT
SYR 10ML LL (SYRINGE) ×1 IMPLANT
SYR 30ML LL (SYRINGE) ×1 IMPLANT
SYR TOOMEY 50ML (SYRINGE) ×1 IMPLANT
TIP FAN IRRIG PULSAVAC PLUS (DISPOSABLE) ×1 IMPLANT
TRAP FLUID SMOKE EVACUATOR (MISCELLANEOUS) ×1 IMPLANT
WATER STERILE IRR 500ML POUR (IV SOLUTION) ×1 IMPLANT

## 2023-06-09 NOTE — Anesthesia Procedure Notes (Signed)
 Anesthesia Regional Block: Interscalene brachial plexus block   Pre-Anesthetic Checklist: , timeout performed,  Correct Patient, Correct Site, Correct Laterality,  Correct Procedure, Correct Position, site marked,  Risks and benefits discussed,  Surgical consent,  Pre-op evaluation,  At surgeon's request and post-op pain management  Laterality: Right  Prep: chloraprep       Needles:  Injection technique: Single-shot  Needle Type: Stimiplex     Needle Length: 9cm  Needle Gauge: 22     Additional Needles:   Procedures:,,,, ultrasound used (permanent image in chart),,    Narrative:  Start time: 06/09/2023 9:32 AM End time: 06/09/2023 9:35 AM Injection made incrementally with aspirations every 5 mL.  Performed by: Personally  Anesthesiologist: Baltazar Bonier, MD  Additional Notes: Patient consented for risk and benefits of nerve block including but not limited to nerve damage, failed block, bleeding and infection.  Patient voiced understanding.  Functioning IV was confirmed and monitors were applied.  Timeout done prior to procedure and prior to any sedation being given to the patient.  Patient confirmed procedure site prior to any sedation given to the patient. Sterile prep,hand hygiene and sterile gloves were used.  Minimal sedation used for procedure.  No paresthesia endorsed by patient during the procedure.  Negative aspiration and negative test dose prior to incremental administration of local anesthetic. The patient tolerated the procedure well with no immediate complications.

## 2023-06-09 NOTE — Anesthesia Preprocedure Evaluation (Addendum)
 Anesthesia Evaluation  Patient identified by MRN, date of birth, ID band Patient awake    Reviewed: Allergy & Precautions, H&P , NPO status , Patient's Chart, lab work & pertinent test results  Airway Mallampati: II  TM Distance: >3 FB Neck ROM: full    Dental no notable dental hx.    Pulmonary asthma    Pulmonary exam normal        Cardiovascular Exercise Tolerance: Good hypertension, + DOE  CAD: Atherosclerotic heart disease of native coronary artery without angina pectoris.Normal cardiovascular exam     Neuro/Psych  PSYCHIATRIC DISORDERS Anxiety Depression    Spondylolysis, cervical region Neck pain with pain in R neck    GI/Hepatic Neg liver ROS, hiatal hernia,GERD  Controlled and Medicated,,  Endo/Other  negative endocrine ROS    Renal/GU      Musculoskeletal  (+) Arthritis ,    Abdominal Normal abdominal exam  (+)   Peds  Hematology negative hematology ROS (+)   Anesthesia Other Findings Cardiology suspicion for coronary artery disease was low 09/2021  Past Medical History: No date: Allergy No date: Anxiety     Comment:  a.) on BZO (alprazolam ) PRN No date: Arthritis No date: Basal cell carcinoma of skin 2015: Bilateral cataracts No date: Carpal tunnel syndrome of right wrist No date: Depression No date: Diverticulosis No date: Gallstones No date: GERD (gastroesophageal reflux disease) No date: History of gluten intolerance No date: History of hiatal hernia No date: Hyperlipemia No date: Hypertension  Past Surgical History: No date: ABDOMINAL HYSTERECTOMY No date: ACHILLES TENDON SURGERY; Right No date: APPENDECTOMY 07/23/2011: CARPAL TUNNEL RELEASE     Comment:  Procedure: CARPAL TUNNEL RELEASE;  Surgeon: Amelie Baize., MD;  Location: [facility];                Service: Orthopedics;  Laterality: Right; No date: CHOLECYSTECTOMY No date: COLONOSCOPY WITH  ESOPHAGOGASTRODUODENOSCOPY (EGD) 07/03/2018: KNEE ARTHROPLASTY; Left     Comment:  Procedure: COMPUTER ASSISTED TOTAL KNEE ARTHROPLASTY -               LEFT;  Surgeon: Arlyne Lame, MD;  Location: [facility];              Service: Orthopedics;  Laterality: Left; 11/22/2018: KNEE ARTHROPLASTY; Right     Comment:  Procedure: COMPUTER ASSISTED TOTAL KNEE ARTHROPLASTY -               RNFA;  Surgeon: Arlyne Lame, MD;  Location: [facility];              Service: Orthopedics;  Laterality: Right; No date: TUBAL LIGATION     Reproductive/Obstetrics negative OB ROS                             Anesthesia Physical Anesthesia Plan  ASA: 3  Anesthesia Plan: General ETT   Post-op Pain Management: Regional block* and Ofirmev  IV (intra-op)*   Induction: Intravenous  PONV Risk Score and Plan: 2 and Ondansetron , Dexamethasone  and Midazolam   Airway Management Planned: Oral ETT  Additional Equipment:   Intra-op Plan:   Post-operative Plan: Extubation in OR  Informed Consent: I have reviewed the patients History and Physical, chart, labs and discussed the procedure including the risks, benefits and alternatives for the proposed anesthesia with the patient or authorized representative who has indicated his/her  understanding and acceptance.     Dental Advisory Given  Plan Discussed with: CRNA and Surgeon  Anesthesia Plan Comments:         Anesthesia Quick Evaluation

## 2023-06-09 NOTE — Anesthesia Procedure Notes (Signed)
 Procedure Name: Intubation Date/Time: 06/09/2023 10:37 AM  Performed by: Jean Michaelis., CRNAPre-anesthesia Checklist: Patient identified, Patient being monitored, Timeout performed, Emergency Drugs available and Suction available Patient Re-evaluated:Patient Re-evaluated prior to induction Oxygen Delivery Method: Circle system utilized Preoxygenation: Pre-oxygenation with 100% oxygen Induction Type: IV induction Ventilation: Mask ventilation without difficulty Laryngoscope Size: 3 and McGrath Grade View: Grade I Tube type: Oral Tube size: 6.5 mm Number of attempts: 1 Airway Equipment and Method: Stylet Placement Confirmation: ETT inserted through vocal cords under direct vision, positive ETCO2 and breath sounds checked- equal and bilateral Secured at: 22 cm Tube secured with: Tape Dental Injury: Teeth and Oropharynx as per pre-operative assessment

## 2023-06-09 NOTE — Discharge Instructions (Addendum)
 Orthopedic discharge instructions: May shower with intact OpSite dressing once nerve block has worn off (Monday).  Apply ice frequently to shoulder or use Polar Care device. Take ibuprofen 600 mg TID with meals for 3-5 days, then as necessary. Take oxycodone  as prescribed when needed.  May supplement with ES Tylenol  if necessary. Keep shoulder immobilizer on at all times except may remove for bathing purposes. Follow-up in 10-14 days or as scheduled.

## 2023-06-09 NOTE — Op Note (Signed)
 06/09/2023  12:43 PM  Patient:   Mackenzie White  Pre-Op Diagnosis:   Advanced degenerative joint disease, right shoulder.  Post-Op Diagnosis:   Same with biceps tendinopathy  Procedure:   Reverse right total shoulder arthroplasty with biceps tenodesis.  Surgeon:   Lonnie Roberts, MD  Assistant:   Edie Goon, PA-C; Merna Aase, PA-S  Anesthesia:   General endotracheal with an interscalene block using Exparel  placed preoperatively by the anesthesiologist.  Findings:   As above.  Complications:   None  EBL:   150 cc  Fluids:   500 cc crystalloid  UOP:   None  TT:   None  Drains:   None  Closure:   Staples  Implants:   All press-fit [employer] Comprehensive system with a #7 Identity micro-humeral stem, a -6 mm extended neutral identity humeral tray with a +0 mm insert, and a medium augmented mini-base plate with a 36 mm +3 lateralized glenosphere.  Brief Clinical Note:   The patient is an 83 year old female with a long history of progressively worsening pain and stiffness of the right shoulder.  The patient's symptoms have progressed despite medications, activity modification, etc. The patient's history and examination are consistent with advanced degenerative joint disease with significant glenoid wear as confirmed by preoperative CT scanning. The patient presents at this time for a reverse right total shoulder arthroplasty with biceps tenodesis.  Procedure:   The patient underwent placement of an interscalene block using Exparel  by the anesthesiologist in the preoperative holding area before being brought into the operating room and lain in the supine position. The patient then underwent general endotracheal intubation and anesthesia before the patient was repositioned in the beach chair position using the beach chair positioner. The right shoulder and upper extremity were prepped with ChloraPrep solution before being draped sterilely. Preoperative antibiotics were  administered. A timeout was performed to verify the appropriate surgical site.    A standard anterior approach to the shoulder was made through an approximately 4-5 inch incision. The incision was carried down through the subcutaneous tissues to expose the deltopectoral fascia. The interval between the deltoid and pectoralis muscles was identified and this plane developed, retracting the cephalic vein laterally with the deltoid muscle. The conjoined tendon was identified. Its lateral margin was dissected and the Kolbel self-retraining retractor inserted. The "three sisters" were identified and cauterized. Bursal tissues were removed to improve visualization.   The biceps tendon was identified near the inferior aspect of the bicipital groove. A soft tissue tenodesis was performed by attaching the biceps tendon to the adjacent pectoralis major tendon using two #0 Ethibond interrupted sutures. The biceps tendon was then transected just proximal to the tenodesis site. The subscapularis tendon was released from its attachment to the lesser tuberosity 1 cm proximal to its insertion and several tagging sutures placed. The inferior capsule was released with care after identifying and protecting the axillary nerve. The proximal humeral cut was made at approximately 25 of retroversion using the extra-medullary guide.   Attention was redirected to the glenoid. The labrum was debrided circumferentially before the center of the glenoid was marked with electrocautery. The guidewire was drilled into the glenoid vault using the appropriate medium augmented guide. After verifying its position, it was overreamed with the mini-baseplate reamer to create a flat surface on its inferior half.  The alignment positioner was inserted and the appropriate pilot hole made at approximately the 5 o'clock position. The augmented reaming guide was then positioned and, after placing the  appropriate motion, the secondary reaming was  completed. The permanent mini-baseplate was impacted into place with care taken to maintain the appropriate orientation. It was stabilized with a 25 x 6.5 mm central screw and four peripheral locking screws. One screw was removed and replaced with a slightly shorter screw so as to ensure proper purchase without stripping it. The permanent 36 mm +3 lateralized glenosphere was then impacted into place and its Morse taper locking mechanism verified using manual distraction.  Attention was directed to the humeral side. The humeral canal was reamed sequentially beginning with the end-cutting reamer then progressing from a 4 mm reamer up to a 7 mm reamer. This provided excellent circumferential chatter. The canal was broached beginning with a # 5 broach and progressing to a # 7 broach.  The plastic stem was inserted into the end of the broach and the proximal reaming performed. A trial reduction was performed using the -6 mm extended neutral humeral platform with the +0 mm insert. With the +0 mm insert, the arm demonstrated excellent range of motion as the hand could be brought across the chest to the opposite shoulder and brought to the top of the patient's head and to the patient's ear. The shoulder appeared stable throughout this range of motion. The joint was dislocated and the trial components removed.   The permanent # 7 identity micro-stem was connected with the -6 mm extended neutral humeral platform on the back table before this construct was impacted into place with care taken to maintain the appropriate version. The +0 mm insert was snapped into place. The shoulder was relocated using two finger pressure and again placed through a range of motion with the findings as described above.  The wound was copiously irrigated with sterile saline solution using the jet lavage system before a total of 30 cc of 0.5% Sensorcaine  with epinephrine  was injected into the pericapsular and peri-incisional tissues to help  with postoperative analgesia. The subscapularis tendon was reapproximated using #2 FiberWire interrupted sutures. The deltopectoral interval was closed using #0 Vicryl interrupted sutures before the subcutaneous tissues were closed using 2-0 Vicryl interrupted sutures. The skin was closed using staples. A sterile occlusive dressing was applied to the wound before the arm was placed into a shoulder immobilizer with an abduction pillow. A Polar Care system also was applied to the shoulder. The patient was then transferred back to a hospital bed before being awakened, extubated, and returned to the recovery room in satisfactory condition after tolerating the procedure well.

## 2023-06-09 NOTE — H&P (Signed)
 History of Present Illness:  Mackenzie White is a 83 y.o. female who presents today as a result of a call to our clinic for right shoulder pain.   The patient's symptoms began several months ago and developed without any specific cause or injury . The patient describes the symptoms as moderate (patient is active but has had to make modifications or give up activities) and have the quality of being aching, miserable, nagging, stabbing, tender, and throbbing. The pain is localized to the lateral arm/shoulder and localized to the anterior shoulder. These symptoms are aggravated with normal daily activities, with sleeping, at higher levels of activity, with overhead activity, and reaching behind the back. She has tried acetaminophen  with limited benefit. She has tried ice and heat with limited benefit. She has not tried any physical therapy or steroid injections for the symptoms. She does note some radiation of her pain into the right side of her neck extending into the right side of her head and face, as well as occasional mild paresthesias to her down her arm to her hand. She is right-hand dominant. This complaint is not work related. She is a sports non-participant.  Shoulder Surgical History:  The patient has had no shoulder surgery in the past.  Current Outpatient Medications:  acetaminophen  (TYLENOL ) 500 MG tablet Take 1,000 mg by mouth every 8 (eight) hours as needed  albuterol MDI, PROVENTIL, VENTOLIN, PROAIR, HFA 90 mcg/actuation inhaler Inhale 1 Puff into the lungs every 4 (four) hours as needed for Shortness of Breath or Wheezing  ALPRAZolam  (XANAX ) 0.25 MG tablet Take by mouth Take 0.25 mg by mouth daily as needed for anxiety.  calcium  carbonate-vitamin D3 (CALCIUM  600 + D,3,) 600 mg calcium - 200 unit Cap Take by mouth Take 1 tablet by mouth at bedtime  cetirizine (ZYRTEC) 10 MG tablet Take 10 mg by mouth once daily  desonide (DESOWEN) 0.05 % cream 1 APPLICATION EXTERNALLY TWICE A DAY AS NEEDED  90 DAYS  diphenhydrAMINE  (BENADRYL ) 25 mg capsule Take 25 mg by mouth nightly  DULoxetine  (CYMBALTA ) 60 MG DR capsule Take 60 mg by mouth once daily 2  esomeprazole  (NEXIUM ) 40 MG DR capsule Take 40 mg by mouth once daily  fluticasone  propionate (FLONASE ) 50 mcg/actuation nasal spray by Nasal route Place 1 spray into both nostrils daily as needed for allergies or rhinitis.  Lacto.acidophilus-Bif.animalis 31 billion cell Cap Take by mouth Take 1 capsule by mouth daily  lisinopril -hydrochlorothiazide  (PRINZIDE ,ZESTORETIC ) 20-12.5 mg tablet Take 1 tablet by mouth once daily 2  simvastatin (ZOCOR) 80 MG tablet Take 80 mg by mouth once daily 1   Allergies:  Aspirin Hives  Gluten Diarrhea, Abdominal Pain and Nausea  Bloating, GI pain  Penicillins Swelling  Did it involve swelling of the face/tongue/throat, SOB, or low BP? Yes Did it involve sudden or severe rash/hives, skin peeling, or any reaction on the inside of your mouth or nose? No Did you need to seek medical attention at a hospital or doctor's office? Yes When did it last happen? 50+ years If all above answers are "NO", may proceed with cephalosporin use.  Sulfa (Sulfonamide Antibiotics) Hives  Tetracyclines Hives  Celebrex  [Celecoxib ] Itching   Past Medical History:  Arthritis  Depression  GERD (gastroesophageal reflux disease)  Hypertension   Past Surgical History:  ENDOSCOPIC CARPAL TUNNEL RELEASE 07/23/2011  Left total knee arthroplasty using computer-assisted navigation 07/03/2018 (Dr Aubry Blase)  Right total knee arthroplasty using computer-assisted navigation 11/22/2018 (Dr Aubry Blase)  Achilles tendon left  APPENDECTOMY  CHOLECYSTECTOMY  HYSTERECTOMY  TUBAL LIGATION   Family History:  No Known Problems Mother   Social History:   Socioeconomic History:  Marital status: Widowed  Number of children: 3  Years of education: 54  Occupational History  Occupation: Part-time -Airline pilot  Tobacco Use  Smoking status: Never   Smokeless tobacco: Never  Vaping Use  Vaping status: Never Used  Substance and Sexual Activity  Alcohol  use: Never  Drug use: Never  Sexual activity: Defer  Partners: Male   Review of Systems:  A comprehensive 14 point ROS was performed, reviewed, and the pertinent orthopaedic findings are documented in the HPI.  Physical Exam:  Vitals:  05/23/23 0931  BP: 122/64  Weight: 88.2 kg (194 lb 6.4 oz)  Height: 170.2 cm (5\' 7" )  PainSc: 7  PainLoc: Shoulder   General/Constitutional: The patient appears to be well-nourished, well-developed, and in no acute distress. Neuro/Psych: Normal mood and affect, oriented to person, place and time. Eyes: Non-icteric. Pupils are equal, round, and reactive to light, and exhibit synchronous movement. ENT: Unremarkable. Lymphatic: No palpable adenopathy. Respiratory: Lungs clear to auscultation, Normal chest excursion, No wheezes, and Non-labored breathing Cardiovascular: Regular rate and rhythm. No murmurs. and No edema, swelling or tenderness, except as noted in detailed exam. Integumentary: No impressive skin lesions present, except as noted in detailed exam. Musculoskeletal: Unremarkable, except as noted in detailed exam.  Right shoulder exam: SKIN: normal SWELLING: none WARMTH: none LYMPH NODES: no adenopathy palpable CREPITUS: glenohumeral joint TENDERNESS: Mild-moderate tenderness over anterolateral shoulder as well as over the lateral border of scapula ROM (active):  Forward flexion: 90 degrees Abduction: 75 degrees Internal rotation: Posterior aspect of iliac crest ROM (passive):  Forward flexion: 120 degrees Abduction: 95 degrees  ER/IR at 90 abd: 40 degrees / 30 degrees  She experiences moderate pain and crepitance with all motions.  STRENGTH: Forward flexion: 4/5 Abduction: 4/5 External rotation: 4/5 Internal rotation: 4/5 Pain with RC testing: Mild-moderate pain with resisted strength testing in all planes.  STABILITY:  Normal  SPECIAL TESTS: Zoila Hines' test: positive, moderate Speed's test: Not evaluated Capsulitis - pain w/ passive ER: no Crossed arm test: Moderately positive Crank: Not evaluated Anterior apprehension: Negative Posterior apprehension: Not evaluated  She is neurovascularly intact to the right upper extremity.  Shoulder X-Ray Imaging: True AP, Y-scapular, and axillary views of the right shoulder are obtained. These films demonstrate significant degenerative changes with complete loss of the glenohumeral joint space, subchondral cystic changes, and and inferior humeral osteophyte. The subacromial space is mildly decreased. There is no subacromial or infra-clavicular spurring, although she does have a laterally downsloping acromion. She demonstrates a Type II acromion.  Assessment:  1. Rotator cuff tendinitis, right  2. Primary osteoarthritis of right shoulder   Plan:  The treatment options were discussed with the patient. In addition, patient educational materials were provided regarding the diagnosis and treatment options. The patient is quite frustrated by her symptoms and functional limitations, and is ready to consider more aggressive treatment options. Therefore, I have recommended a surgical procedure, specifically a reverse right total shoulder arthroplasty. The procedure was discussed with the patient, as were the potential risks (including bleeding, infection, nerve and/or blood vessel injury, persistent or recurrent pain, loosening and/or failure of the components, dislocation, need for further surgery, blood clots, strokes, heart attacks and/or arhythmias, pneumonia, etc.) and benefits. The patient states his/her understanding and wishes to proceed. All of the patient's questions and concerns were answered. She can call any time with further concerns. She  will follow up post-surgery, routine.    H&P reviewed and patient re-examined. No changes.

## 2023-06-09 NOTE — Evaluation (Signed)
 Occupational Therapy Evaluation Patient Details Name: Mackenzie White MRN: [id number] DOB: 09/22/1940 Today's Date: 06/09/2023   History of Present Illness   83 year old female s/p Reverse right total shoulder arthroplasty with biceps tenodesis on 06/09/23   Clinical Impressions [name] was seen for OT evaluation this date. Prior to hospital admission, pt was IND. Pt lives with grandson, plan for daughter to stay. Pt currently requires MAX A don/doff shirt, polar care, and sling in sitting. SUPERVISION for ADL t/f. Pt instructed in polar care mgt, compression stockings mgt, sling/immobilizer mgt, ROM exercises for RUE (with instructions for no shoulder exercises until full sensation has returned), RUE precautions, adaptive strategies for bathing/dressing, and positioning for sleep. All education complete, will sign off. Upon hospital discharge, recommend no OT follow up.    If plan is discharge home, recommend the following:   Help with stairs or ramp for entrance;A little help with bathing/dressing/bathroom;A little help with walking and/or transfers     Functional Status Assessment   Patient has had a recent decline in their functional status and demonstrates the ability to make significant improvements in function in a reasonable and predictable amount of time.     Equipment Recommendations   None recommended by OT     Recommendations for Other Services         Precautions/Restrictions   Precautions Precautions: Fall;Shoulder Shoulder Interventions: Shoulder sling/immobilizer;Shoulder abduction pillow Restrictions Weight Bearing Restrictions Per Provider Order: Yes RUE Weight Bearing Per Provider Order: Non weight bearing     Mobility Bed Mobility               General bed mobility comments: not tested    Transfers Overall transfer level: Needs assistance Equipment used: None Transfers: Sit to/from Stand Sit to Stand: Supervision                   Balance Overall balance assessment: Needs assistance Sitting-balance support: No upper extremity supported, Feet supported Sitting balance-Leahy Scale: Good     Standing balance support: No upper extremity supported Standing balance-Leahy Scale: Fair                             ADL either performed or assessed with clinical judgement   ADL Overall ADL's : Needs assistance/impaired                                       General ADL Comments: MAX A don/doff shirt, polar care, and sling in sitting. SUPERVISION for ADL t/f.      Pertinent Vitals/Pain Pain Assessment Pain Assessment: No/denies pain     Extremity/Trunk Assessment Upper Extremity Assessment Upper Extremity Assessment: RUE deficits/detail RUE: Unable to fully assess due to immobilization   Lower Extremity Assessment Lower Extremity Assessment: Overall WFL for tasks assessed       Communication Communication Communication: No apparent difficulties   Cognition Arousal: Alert Behavior During Therapy: WFL for tasks assessed/performed Cognition: No apparent impairments                               Following commands: Intact                  Home Living Family/patient expects to be discharged to:: Private residence Living Arrangements: Spouse/significant other;Other relatives Available Help at Discharge: Family;Available  24 hours/day Type of Home: House Home Access: Ramped entrance     Home Layout: One level               Home Equipment: Agricultural consultant (2 wheels)          Prior Functioning/Environment Prior Level of Function : Independent/Modified Independent;Driving                    OT Problem List: Decreased activity tolerance;Impaired UE functional use        OT Goals(Current goals can be found in the care plan section)   Acute Rehab OT Goals Patient Stated Goal: go home OT Goal Formulation: With patient/family Time  For Goal Achievement: 06/09/23 Potential to Achieve Goals: Good   AM-PAC OT "6 Clicks" Daily Activity     Outcome Measure Help from another person eating meals?: None Help from another person taking care of personal grooming?: A Little Help from another person toileting, which includes using toliet, bedpan, or urinal?: A Little Help from another person bathing (including washing, rinsing, drying)?: A Lot Help from another person to put on and taking off regular upper body clothing?: A Lot Help from another person to put on and taking off regular lower body clothing?: A Lot 6 Click Score: 16   End of Session    Activity Tolerance: Patient tolerated treatment well Patient left: in chair;with call bell/phone within reach;with family/visitor present;with nursing/sitter in room  OT Visit Diagnosis: Unsteadiness on feet (R26.81)                Time: 1610-9604 OT Time Calculation (min): 48 min Charges:  OT General Charges $OT Visit: 1 Visit OT Evaluation $OT Eval Low Complexity: 1 Low OT Treatments $Self Care/Home Management : 38-52 mins  Gordan Latina, M.S. OTR/L  06/09/23, 4:28 PM  ascom [phone]

## 2023-06-09 NOTE — Transfer of Care (Signed)
 Immediate Anesthesia Transfer of Care Note  Patient: Mackenzie White  Procedure(s) Performed: ARTHROPLASTY, SHOULDER, TOTAL, REVERSE (Right: Shoulder)  Patient Location: PACU  Anesthesia Type:General  Level of Consciousness: awake and alert   Airway & Oxygen Therapy: Patient Spontanous Breathing and Patient connected to nasal cannula oxygen  Post-op Assessment: Report given to RN and Post -op Vital signs reviewed and stable  Post vital signs: stable on 2L O2  Last Vitals:  Vitals Value Taken Time  BP 102/86 06/09/23 1311  Temp 36.9 C 06/09/23 1311  Pulse 88 06/09/23 1314  Resp 13 06/09/23 1314  SpO2 97 % 06/09/23 1314  Vitals shown include unfiled device data.  Last Pain:  Vitals:   06/09/23 0932  PainSc: 0-No pain         Complications: No notable events documented.

## 2023-06-10 ENCOUNTER — Encounter: Payer: Self-pay | Admitting: Surgery

## 2023-06-10 NOTE — Anesthesia Postprocedure Evaluation (Signed)
 Anesthesia Post Note  Patient: Mackenzie White  Procedure(s) Performed: ARTHROPLASTY, SHOULDER, TOTAL, REVERSE (Right: Shoulder)  Patient location during evaluation: PACU Anesthesia Type: General Level of consciousness: awake and alert Pain management: pain level controlled Vital Signs Assessment: post-procedure vital signs reviewed and stable Respiratory status: spontaneous breathing, nonlabored ventilation and respiratory function stable Cardiovascular status: blood pressure returned to baseline and stable Postop Assessment: no apparent nausea or vomiting Anesthetic complications: no   There were no known notable events for this encounter.   Last Vitals:  Vitals:   06/09/23 1446 06/09/23 1606  BP: 139/67 (!) 154/87  Pulse: 88 92  Resp: 16 17  Temp:  37.1 C  SpO2: 93% 95%    Last Pain:  Vitals:   06/09/23 1606  TempSrc: Temporal  PainSc:                  Baltazar Bonier

## 2023-06-11 DIAGNOSIS — K5900 Constipation, unspecified: Secondary | ICD-10-CM | POA: Diagnosis not present

## 2023-06-11 DIAGNOSIS — Z471 Aftercare following joint replacement surgery: Secondary | ICD-10-CM | POA: Diagnosis not present

## 2023-06-11 DIAGNOSIS — I2510 Atherosclerotic heart disease of native coronary artery without angina pectoris: Secondary | ICD-10-CM | POA: Diagnosis not present

## 2023-06-11 DIAGNOSIS — J4520 Mild intermittent asthma, uncomplicated: Secondary | ICD-10-CM | POA: Diagnosis not present

## 2023-06-11 DIAGNOSIS — K219 Gastro-esophageal reflux disease without esophagitis: Secondary | ICD-10-CM | POA: Diagnosis not present

## 2023-06-11 DIAGNOSIS — I10 Essential (primary) hypertension: Secondary | ICD-10-CM | POA: Diagnosis not present

## 2023-06-11 DIAGNOSIS — F331 Major depressive disorder, recurrent, moderate: Secondary | ICD-10-CM | POA: Diagnosis not present

## 2023-06-11 DIAGNOSIS — E119 Type 2 diabetes mellitus without complications: Secondary | ICD-10-CM | POA: Diagnosis not present

## 2023-06-11 DIAGNOSIS — R32 Unspecified urinary incontinence: Secondary | ICD-10-CM | POA: Diagnosis not present

## 2023-06-14 DIAGNOSIS — I2510 Atherosclerotic heart disease of native coronary artery without angina pectoris: Secondary | ICD-10-CM | POA: Diagnosis not present

## 2023-06-14 DIAGNOSIS — Z471 Aftercare following joint replacement surgery: Secondary | ICD-10-CM | POA: Diagnosis not present

## 2023-06-14 DIAGNOSIS — E119 Type 2 diabetes mellitus without complications: Secondary | ICD-10-CM | POA: Diagnosis not present

## 2023-06-14 DIAGNOSIS — K219 Gastro-esophageal reflux disease without esophagitis: Secondary | ICD-10-CM | POA: Diagnosis not present

## 2023-06-14 DIAGNOSIS — I10 Essential (primary) hypertension: Secondary | ICD-10-CM | POA: Diagnosis not present

## 2023-06-14 DIAGNOSIS — K5900 Constipation, unspecified: Secondary | ICD-10-CM | POA: Diagnosis not present

## 2023-06-14 DIAGNOSIS — R32 Unspecified urinary incontinence: Secondary | ICD-10-CM | POA: Diagnosis not present

## 2023-06-14 DIAGNOSIS — F331 Major depressive disorder, recurrent, moderate: Secondary | ICD-10-CM | POA: Diagnosis not present

## 2023-06-14 DIAGNOSIS — J4520 Mild intermittent asthma, uncomplicated: Secondary | ICD-10-CM | POA: Diagnosis not present

## 2023-06-16 DIAGNOSIS — I10 Essential (primary) hypertension: Secondary | ICD-10-CM | POA: Diagnosis not present

## 2023-06-16 DIAGNOSIS — J4520 Mild intermittent asthma, uncomplicated: Secondary | ICD-10-CM | POA: Diagnosis not present

## 2023-06-16 DIAGNOSIS — K5900 Constipation, unspecified: Secondary | ICD-10-CM | POA: Diagnosis not present

## 2023-06-16 DIAGNOSIS — Z471 Aftercare following joint replacement surgery: Secondary | ICD-10-CM | POA: Diagnosis not present

## 2023-06-16 DIAGNOSIS — E119 Type 2 diabetes mellitus without complications: Secondary | ICD-10-CM | POA: Diagnosis not present

## 2023-06-16 DIAGNOSIS — F331 Major depressive disorder, recurrent, moderate: Secondary | ICD-10-CM | POA: Diagnosis not present

## 2023-06-16 DIAGNOSIS — K219 Gastro-esophageal reflux disease without esophagitis: Secondary | ICD-10-CM | POA: Diagnosis not present

## 2023-06-16 DIAGNOSIS — I2510 Atherosclerotic heart disease of native coronary artery without angina pectoris: Secondary | ICD-10-CM | POA: Diagnosis not present

## 2023-06-16 DIAGNOSIS — R32 Unspecified urinary incontinence: Secondary | ICD-10-CM | POA: Diagnosis not present

## 2023-06-22 DIAGNOSIS — K5900 Constipation, unspecified: Secondary | ICD-10-CM | POA: Diagnosis not present

## 2023-06-22 DIAGNOSIS — Z471 Aftercare following joint replacement surgery: Secondary | ICD-10-CM | POA: Diagnosis not present

## 2023-06-22 DIAGNOSIS — I2510 Atherosclerotic heart disease of native coronary artery without angina pectoris: Secondary | ICD-10-CM | POA: Diagnosis not present

## 2023-06-22 DIAGNOSIS — I10 Essential (primary) hypertension: Secondary | ICD-10-CM | POA: Diagnosis not present

## 2023-06-22 DIAGNOSIS — F331 Major depressive disorder, recurrent, moderate: Secondary | ICD-10-CM | POA: Diagnosis not present

## 2023-06-22 DIAGNOSIS — E119 Type 2 diabetes mellitus without complications: Secondary | ICD-10-CM | POA: Diagnosis not present

## 2023-06-22 DIAGNOSIS — J4520 Mild intermittent asthma, uncomplicated: Secondary | ICD-10-CM | POA: Diagnosis not present

## 2023-06-22 DIAGNOSIS — K219 Gastro-esophageal reflux disease without esophagitis: Secondary | ICD-10-CM | POA: Diagnosis not present

## 2023-06-22 DIAGNOSIS — R32 Unspecified urinary incontinence: Secondary | ICD-10-CM | POA: Diagnosis not present

## 2023-06-24 DIAGNOSIS — G8929 Other chronic pain: Secondary | ICD-10-CM | POA: Diagnosis not present

## 2023-06-24 DIAGNOSIS — M25611 Stiffness of right shoulder, not elsewhere classified: Secondary | ICD-10-CM | POA: Diagnosis not present

## 2023-06-24 DIAGNOSIS — M25511 Pain in right shoulder: Secondary | ICD-10-CM | POA: Diagnosis not present

## 2023-06-24 DIAGNOSIS — Z96611 Presence of right artificial shoulder joint: Secondary | ICD-10-CM | POA: Diagnosis not present

## 2023-06-24 DIAGNOSIS — M6281 Muscle weakness (generalized): Secondary | ICD-10-CM | POA: Diagnosis not present

## 2023-06-29 DIAGNOSIS — M25511 Pain in right shoulder: Secondary | ICD-10-CM | POA: Diagnosis not present

## 2023-06-29 DIAGNOSIS — Z96611 Presence of right artificial shoulder joint: Secondary | ICD-10-CM | POA: Diagnosis not present

## 2023-07-06 DIAGNOSIS — M25511 Pain in right shoulder: Secondary | ICD-10-CM | POA: Diagnosis not present

## 2023-07-06 DIAGNOSIS — Z96611 Presence of right artificial shoulder joint: Secondary | ICD-10-CM | POA: Diagnosis not present

## 2023-07-07 ENCOUNTER — Encounter: Payer: Self-pay | Admitting: Surgery

## 2023-07-12 DIAGNOSIS — M6281 Muscle weakness (generalized): Secondary | ICD-10-CM | POA: Diagnosis not present

## 2023-07-12 DIAGNOSIS — M25611 Stiffness of right shoulder, not elsewhere classified: Secondary | ICD-10-CM | POA: Diagnosis not present

## 2023-07-12 DIAGNOSIS — M25511 Pain in right shoulder: Secondary | ICD-10-CM | POA: Diagnosis not present

## 2023-07-12 DIAGNOSIS — Z96611 Presence of right artificial shoulder joint: Secondary | ICD-10-CM | POA: Diagnosis not present

## 2023-07-12 DIAGNOSIS — G8929 Other chronic pain: Secondary | ICD-10-CM | POA: Diagnosis not present

## 2023-07-22 DIAGNOSIS — Z96611 Presence of right artificial shoulder joint: Secondary | ICD-10-CM | POA: Diagnosis not present

## 2023-08-01 ENCOUNTER — Emergency Department

## 2023-08-01 ENCOUNTER — Other Ambulatory Visit: Payer: Self-pay

## 2023-08-01 ENCOUNTER — Emergency Department
Admission: EM | Admit: 2023-08-01 | Discharge: 2023-08-01 | Disposition: A | Discharge status: Home / Self Care | Attending: Emergency Medicine | Admitting: Emergency Medicine

## 2023-08-01 DIAGNOSIS — R42 Dizziness and giddiness: Secondary | ICD-10-CM | POA: Diagnosis not present

## 2023-08-01 DIAGNOSIS — Z96611 Presence of right artificial shoulder joint: Secondary | ICD-10-CM | POA: Diagnosis not present

## 2023-08-01 DIAGNOSIS — Z5321 Procedure and treatment not carried out due to patient leaving prior to being seen by health care provider: Secondary | ICD-10-CM | POA: Insufficient documentation

## 2023-08-01 LAB — CBC
HCT: 38.9 % (ref 36.0–46.0)
Hemoglobin: 12.6 g/dL (ref 12.0–15.0)
MCH: 27.9 pg (ref 26.0–34.0)
MCHC: 32.4 g/dL (ref 30.0–36.0)
MCV: 86.3 fL (ref 80.0–100.0)
Platelets: 392 10*3/uL (ref 150–400)
RBC: 4.51 MIL/uL (ref 3.87–5.11)
RDW: 13.3 % (ref 11.5–15.5)
WBC: 9.2 10*3/uL (ref 4.0–10.5)
nRBC: 0 % (ref 0.0–0.2)

## 2023-08-01 LAB — BASIC METABOLIC PANEL WITH GFR
Anion gap: 13 (ref 5–15)
BUN: 14 mg/dL (ref 8–23)
CO2: 23 mmol/L (ref 22–32)
Calcium: 8.9 mg/dL (ref 8.9–10.3)
Chloride: 95 mmol/L — ABNORMAL LOW (ref 98–111)
Creatinine, Ser: 1.17 mg/dL — ABNORMAL HIGH (ref 0.44–1.00)
GFR, Estimated: 47 mL/min — ABNORMAL LOW (ref >60)
Glucose, Bld: 119 mg/dL — ABNORMAL HIGH (ref 70–99)
Potassium: 4.3 mmol/L (ref 3.5–5.1)
Sodium: 131 mmol/L — ABNORMAL LOW (ref 135–145)

## 2023-08-01 LAB — TROPONIN I (HIGH SENSITIVITY)
Troponin I (High Sensitivity): 27 ng/L — ABNORMAL HIGH (ref <18)
Troponin I (High Sensitivity): 24 ng/L — ABNORMAL HIGH (ref <18)

## 2023-08-01 NOTE — ED Triage Notes (Signed)
 Pt comes in from physical therapy with complaints of dizziness. Pt states that she doesn't feel dizziness with sitting, but does with movement. Pt hypotensive in triage 90/58. Pt is alert and oriented x4. With no complaints of pain.

## 2023-08-01 NOTE — ED Triage Notes (Signed)
 First nurse note: Pt here via [facility] clinic for tunnel vision and dizziness that started 2 days. Pt hypotensive per [facility] staff.

## 2023-08-02 ENCOUNTER — Telehealth: Payer: Self-pay | Admitting: Emergency Medicine

## 2023-08-02 DIAGNOSIS — H348322 Tributary (branch) retinal vein occlusion, left eye, stable: Secondary | ICD-10-CM | POA: Diagnosis not present

## 2023-08-02 DIAGNOSIS — H43813 Vitreous degeneration, bilateral: Secondary | ICD-10-CM | POA: Diagnosis not present

## 2023-08-02 DIAGNOSIS — H35363 Drusen (degenerative) of macula, bilateral: Secondary | ICD-10-CM | POA: Diagnosis not present

## 2023-08-02 NOTE — Telephone Encounter (Signed)
 Called patient due to left emergency department before provider exam to inquire about condition and follow up plans. Says she took a mucinex and is feeling better today.  She is going to appt right now, but says she will call her pcp and have the labs reviewed and tell them about the dizziness she had.

## 2023-08-04 DIAGNOSIS — G8929 Other chronic pain: Secondary | ICD-10-CM | POA: Diagnosis not present

## 2023-08-04 DIAGNOSIS — M25611 Stiffness of right shoulder, not elsewhere classified: Secondary | ICD-10-CM | POA: Diagnosis not present

## 2023-08-04 DIAGNOSIS — M25511 Pain in right shoulder: Secondary | ICD-10-CM | POA: Diagnosis not present

## 2023-08-04 DIAGNOSIS — M6281 Muscle weakness (generalized): Secondary | ICD-10-CM | POA: Diagnosis not present

## 2023-08-04 DIAGNOSIS — Z96611 Presence of right artificial shoulder joint: Secondary | ICD-10-CM | POA: Diagnosis not present

## 2023-08-09 DIAGNOSIS — Z96611 Presence of right artificial shoulder joint: Secondary | ICD-10-CM | POA: Diagnosis not present

## 2023-08-09 DIAGNOSIS — M25511 Pain in right shoulder: Secondary | ICD-10-CM | POA: Diagnosis not present

## 2023-08-12 DIAGNOSIS — M25611 Stiffness of right shoulder, not elsewhere classified: Secondary | ICD-10-CM | POA: Diagnosis not present

## 2023-08-12 DIAGNOSIS — Z96611 Presence of right artificial shoulder joint: Secondary | ICD-10-CM | POA: Diagnosis not present

## 2023-08-18 DIAGNOSIS — G8929 Other chronic pain: Secondary | ICD-10-CM | POA: Diagnosis not present

## 2023-08-18 DIAGNOSIS — M6281 Muscle weakness (generalized): Secondary | ICD-10-CM | POA: Diagnosis not present

## 2023-08-18 DIAGNOSIS — M25511 Pain in right shoulder: Secondary | ICD-10-CM | POA: Diagnosis not present

## 2023-08-18 DIAGNOSIS — M25611 Stiffness of right shoulder, not elsewhere classified: Secondary | ICD-10-CM | POA: Diagnosis not present

## 2023-08-18 DIAGNOSIS — Z96611 Presence of right artificial shoulder joint: Secondary | ICD-10-CM | POA: Diagnosis not present

## 2023-08-24 DIAGNOSIS — M25511 Pain in right shoulder: Secondary | ICD-10-CM | POA: Diagnosis not present

## 2023-08-24 DIAGNOSIS — Z96611 Presence of right artificial shoulder joint: Secondary | ICD-10-CM | POA: Diagnosis not present

## 2023-08-26 DIAGNOSIS — Z96611 Presence of right artificial shoulder joint: Secondary | ICD-10-CM | POA: Diagnosis not present

## 2023-08-26 DIAGNOSIS — M25562 Pain in left knee: Secondary | ICD-10-CM | POA: Diagnosis not present

## 2023-08-29 DIAGNOSIS — M6281 Muscle weakness (generalized): Secondary | ICD-10-CM | POA: Diagnosis not present

## 2023-08-29 DIAGNOSIS — M25611 Stiffness of right shoulder, not elsewhere classified: Secondary | ICD-10-CM | POA: Diagnosis not present

## 2023-08-29 DIAGNOSIS — M25511 Pain in right shoulder: Secondary | ICD-10-CM | POA: Diagnosis not present

## 2023-08-29 DIAGNOSIS — Z96611 Presence of right artificial shoulder joint: Secondary | ICD-10-CM | POA: Diagnosis not present

## 2023-08-29 DIAGNOSIS — G8929 Other chronic pain: Secondary | ICD-10-CM | POA: Diagnosis not present

## 2023-09-02 DIAGNOSIS — M25611 Stiffness of right shoulder, not elsewhere classified: Secondary | ICD-10-CM | POA: Diagnosis not present

## 2023-09-02 DIAGNOSIS — Z96611 Presence of right artificial shoulder joint: Secondary | ICD-10-CM | POA: Diagnosis not present

## 2023-09-02 DIAGNOSIS — M6281 Muscle weakness (generalized): Secondary | ICD-10-CM | POA: Diagnosis not present

## 2023-09-02 DIAGNOSIS — M25511 Pain in right shoulder: Secondary | ICD-10-CM | POA: Diagnosis not present

## 2023-09-02 DIAGNOSIS — G8929 Other chronic pain: Secondary | ICD-10-CM | POA: Diagnosis not present

## 2023-09-05 DIAGNOSIS — Z96611 Presence of right artificial shoulder joint: Secondary | ICD-10-CM | POA: Diagnosis not present

## 2023-09-05 DIAGNOSIS — M25511 Pain in right shoulder: Secondary | ICD-10-CM | POA: Diagnosis not present

## 2023-09-07 DIAGNOSIS — M19012 Primary osteoarthritis, left shoulder: Secondary | ICD-10-CM | POA: Diagnosis not present

## 2023-09-07 DIAGNOSIS — G8929 Other chronic pain: Secondary | ICD-10-CM | POA: Diagnosis not present

## 2023-09-07 DIAGNOSIS — M25512 Pain in left shoulder: Secondary | ICD-10-CM | POA: Diagnosis not present

## 2023-09-07 DIAGNOSIS — Z96611 Presence of right artificial shoulder joint: Secondary | ICD-10-CM | POA: Diagnosis not present

## 2023-09-07 DIAGNOSIS — M25511 Pain in right shoulder: Secondary | ICD-10-CM | POA: Diagnosis not present

## 2023-09-13 DIAGNOSIS — M25611 Stiffness of right shoulder, not elsewhere classified: Secondary | ICD-10-CM | POA: Diagnosis not present

## 2023-09-13 DIAGNOSIS — Z96611 Presence of right artificial shoulder joint: Secondary | ICD-10-CM | POA: Diagnosis not present

## 2023-09-14 ENCOUNTER — Other Ambulatory Visit: Payer: Self-pay | Admitting: Student

## 2023-09-14 DIAGNOSIS — M19012 Primary osteoarthritis, left shoulder: Secondary | ICD-10-CM

## 2023-09-14 DIAGNOSIS — G8929 Other chronic pain: Secondary | ICD-10-CM

## 2023-09-16 DIAGNOSIS — M25611 Stiffness of right shoulder, not elsewhere classified: Secondary | ICD-10-CM | POA: Diagnosis not present

## 2023-09-16 DIAGNOSIS — M6281 Muscle weakness (generalized): Secondary | ICD-10-CM | POA: Diagnosis not present

## 2023-09-16 DIAGNOSIS — G8929 Other chronic pain: Secondary | ICD-10-CM | POA: Diagnosis not present

## 2023-09-16 DIAGNOSIS — Z96611 Presence of right artificial shoulder joint: Secondary | ICD-10-CM | POA: Diagnosis not present

## 2023-09-16 DIAGNOSIS — M25511 Pain in right shoulder: Secondary | ICD-10-CM | POA: Diagnosis not present

## 2023-09-20 DIAGNOSIS — Z96611 Presence of right artificial shoulder joint: Secondary | ICD-10-CM | POA: Diagnosis not present

## 2023-09-20 DIAGNOSIS — M25511 Pain in right shoulder: Secondary | ICD-10-CM | POA: Diagnosis not present

## 2023-09-23 DIAGNOSIS — M25611 Stiffness of right shoulder, not elsewhere classified: Secondary | ICD-10-CM | POA: Diagnosis not present

## 2023-09-23 DIAGNOSIS — Z96611 Presence of right artificial shoulder joint: Secondary | ICD-10-CM | POA: Diagnosis not present

## 2023-09-23 DIAGNOSIS — M25511 Pain in right shoulder: Secondary | ICD-10-CM | POA: Diagnosis not present

## 2023-09-23 DIAGNOSIS — M6281 Muscle weakness (generalized): Secondary | ICD-10-CM | POA: Diagnosis not present

## 2023-09-23 DIAGNOSIS — G8929 Other chronic pain: Secondary | ICD-10-CM | POA: Diagnosis not present

## 2023-09-26 DIAGNOSIS — E7849 Other hyperlipidemia: Secondary | ICD-10-CM | POA: Diagnosis not present

## 2023-09-26 DIAGNOSIS — E119 Type 2 diabetes mellitus without complications: Secondary | ICD-10-CM | POA: Diagnosis not present

## 2023-09-26 DIAGNOSIS — K219 Gastro-esophageal reflux disease without esophagitis: Secondary | ICD-10-CM | POA: Diagnosis not present

## 2023-09-26 DIAGNOSIS — I10 Essential (primary) hypertension: Secondary | ICD-10-CM | POA: Diagnosis not present

## 2023-09-26 DIAGNOSIS — E7800 Pure hypercholesterolemia, unspecified: Secondary | ICD-10-CM | POA: Diagnosis not present

## 2023-09-27 DIAGNOSIS — M6281 Muscle weakness (generalized): Secondary | ICD-10-CM | POA: Diagnosis not present

## 2023-09-27 DIAGNOSIS — M25611 Stiffness of right shoulder, not elsewhere classified: Secondary | ICD-10-CM | POA: Diagnosis not present

## 2023-09-27 DIAGNOSIS — Z96611 Presence of right artificial shoulder joint: Secondary | ICD-10-CM | POA: Diagnosis not present

## 2023-09-27 DIAGNOSIS — M25511 Pain in right shoulder: Secondary | ICD-10-CM | POA: Diagnosis not present

## 2023-09-27 DIAGNOSIS — G8929 Other chronic pain: Secondary | ICD-10-CM | POA: Diagnosis not present

## 2023-09-30 DIAGNOSIS — M6281 Muscle weakness (generalized): Secondary | ICD-10-CM | POA: Diagnosis not present

## 2023-09-30 DIAGNOSIS — G8929 Other chronic pain: Secondary | ICD-10-CM | POA: Diagnosis not present

## 2023-09-30 DIAGNOSIS — M25611 Stiffness of right shoulder, not elsewhere classified: Secondary | ICD-10-CM | POA: Diagnosis not present

## 2023-09-30 DIAGNOSIS — M25511 Pain in right shoulder: Secondary | ICD-10-CM | POA: Diagnosis not present

## 2023-09-30 DIAGNOSIS — Z96611 Presence of right artificial shoulder joint: Secondary | ICD-10-CM | POA: Diagnosis not present

## 2023-10-03 DIAGNOSIS — K219 Gastro-esophageal reflux disease without esophagitis: Secondary | ICD-10-CM | POA: Diagnosis not present

## 2023-10-03 DIAGNOSIS — R0609 Other forms of dyspnea: Secondary | ICD-10-CM | POA: Diagnosis not present

## 2023-10-03 DIAGNOSIS — E669 Obesity, unspecified: Secondary | ICD-10-CM | POA: Diagnosis not present

## 2023-10-03 DIAGNOSIS — E7800 Pure hypercholesterolemia, unspecified: Secondary | ICD-10-CM | POA: Diagnosis not present

## 2023-10-03 DIAGNOSIS — E119 Type 2 diabetes mellitus without complications: Secondary | ICD-10-CM | POA: Diagnosis not present

## 2023-10-03 DIAGNOSIS — I10 Essential (primary) hypertension: Secondary | ICD-10-CM | POA: Diagnosis not present

## 2023-10-03 DIAGNOSIS — I77819 Aortic ectasia, unspecified site: Secondary | ICD-10-CM | POA: Diagnosis not present

## 2023-10-03 DIAGNOSIS — R82998 Other abnormal findings in urine: Secondary | ICD-10-CM | POA: Diagnosis not present

## 2023-10-03 DIAGNOSIS — K760 Fatty (change of) liver, not elsewhere classified: Secondary | ICD-10-CM | POA: Diagnosis not present

## 2023-10-06 DIAGNOSIS — M6281 Muscle weakness (generalized): Secondary | ICD-10-CM | POA: Diagnosis not present

## 2023-10-06 DIAGNOSIS — M25611 Stiffness of right shoulder, not elsewhere classified: Secondary | ICD-10-CM | POA: Diagnosis not present

## 2023-10-06 DIAGNOSIS — M25511 Pain in right shoulder: Secondary | ICD-10-CM | POA: Diagnosis not present

## 2023-10-06 DIAGNOSIS — G8929 Other chronic pain: Secondary | ICD-10-CM | POA: Diagnosis not present

## 2023-10-06 DIAGNOSIS — Z96611 Presence of right artificial shoulder joint: Secondary | ICD-10-CM | POA: Diagnosis not present

## 2023-10-10 DIAGNOSIS — M25511 Pain in right shoulder: Secondary | ICD-10-CM | POA: Diagnosis not present

## 2023-10-10 DIAGNOSIS — Z96611 Presence of right artificial shoulder joint: Secondary | ICD-10-CM | POA: Diagnosis not present

## 2023-10-13 DIAGNOSIS — G8929 Other chronic pain: Secondary | ICD-10-CM | POA: Diagnosis not present

## 2023-10-13 DIAGNOSIS — M25611 Stiffness of right shoulder, not elsewhere classified: Secondary | ICD-10-CM | POA: Diagnosis not present

## 2023-10-13 DIAGNOSIS — M25511 Pain in right shoulder: Secondary | ICD-10-CM | POA: Diagnosis not present

## 2023-10-13 DIAGNOSIS — M6281 Muscle weakness (generalized): Secondary | ICD-10-CM | POA: Diagnosis not present

## 2023-10-13 DIAGNOSIS — Z96611 Presence of right artificial shoulder joint: Secondary | ICD-10-CM | POA: Diagnosis not present

## 2023-11-03 DIAGNOSIS — Z0000 Encounter for general adult medical examination without abnormal findings: Secondary | ICD-10-CM | POA: Diagnosis not present

## 2023-11-03 DIAGNOSIS — Z1331 Encounter for screening for depression: Secondary | ICD-10-CM | POA: Diagnosis not present

## 2023-11-03 DIAGNOSIS — Z1339 Encounter for screening examination for other mental health and behavioral disorders: Secondary | ICD-10-CM | POA: Diagnosis not present

## 2023-11-03 DIAGNOSIS — Z96652 Presence of left artificial knee joint: Secondary | ICD-10-CM | POA: Diagnosis not present

## 2023-11-24 ENCOUNTER — Ambulatory Visit
Admission: RE | Admit: 2023-11-24 | Discharge: 2023-11-24 | Disposition: A | Discharge status: Home / Self Care | Source: Ambulatory Visit | Attending: Student | Admitting: Student

## 2023-11-24 DIAGNOSIS — M25512 Pain in left shoulder: Secondary | ICD-10-CM | POA: Insufficient documentation

## 2023-11-24 DIAGNOSIS — G8929 Other chronic pain: Secondary | ICD-10-CM | POA: Diagnosis not present

## 2023-11-24 DIAGNOSIS — M19012 Primary osteoarthritis, left shoulder: Secondary | ICD-10-CM | POA: Diagnosis not present

## 2023-11-24 DIAGNOSIS — I700 Atherosclerosis of aorta: Secondary | ICD-10-CM | POA: Diagnosis not present

## 2023-12-09 DIAGNOSIS — M7581 Other shoulder lesions, right shoulder: Secondary | ICD-10-CM | POA: Diagnosis not present

## 2023-12-09 DIAGNOSIS — E119 Type 2 diabetes mellitus without complications: Secondary | ICD-10-CM | POA: Diagnosis not present

## 2023-12-09 DIAGNOSIS — Z96611 Presence of right artificial shoulder joint: Secondary | ICD-10-CM | POA: Diagnosis not present

## 2023-12-09 DIAGNOSIS — M19011 Primary osteoarthritis, right shoulder: Secondary | ICD-10-CM | POA: Diagnosis not present

## 2023-12-26 ENCOUNTER — Other Ambulatory Visit: Payer: Self-pay | Admitting: Student

## 2023-12-26 DIAGNOSIS — G8929 Other chronic pain: Secondary | ICD-10-CM

## 2023-12-26 DIAGNOSIS — M19012 Primary osteoarthritis, left shoulder: Secondary | ICD-10-CM
